# Patient Record
Sex: Female | Born: 1955 | Race: White | Hispanic: No | Marital: Married | Smoking: Former smoker
Health system: Southern US, Community
[De-identification: ages and names within clinical notes are randomized; demographics above are authoritative.]

## PROBLEM LIST (undated history)

## (undated) DIAGNOSIS — M1990 Unspecified osteoarthritis, unspecified site: Secondary | ICD-10-CM

## (undated) DIAGNOSIS — G8929 Other chronic pain: Secondary | ICD-10-CM

## (undated) DIAGNOSIS — M810 Age-related osteoporosis without current pathological fracture: Secondary | ICD-10-CM

## (undated) DIAGNOSIS — E785 Hyperlipidemia, unspecified: Secondary | ICD-10-CM

## (undated) HISTORY — PX: TONSILLECTOMY: SUR1361

## (undated) HISTORY — DX: Hyperlipidemia, unspecified: E78.5

## (undated) HISTORY — DX: Other chronic pain: G89.29

## (undated) HISTORY — PX: WISDOM TOOTH EXTRACTION: SHX21

## (undated) HISTORY — DX: Age-related osteoporosis without current pathological fracture: M81.0

## (undated) HISTORY — DX: Unspecified osteoarthritis, unspecified site: M19.90

---

## 2016-10-19 HISTORY — PX: POLYPECTOMY: SHX149

## 2017-04-19 ENCOUNTER — Encounter (INDEPENDENT_AMBULATORY_CARE_PROVIDER_SITE_OTHER): Payer: Self-pay | Admitting: *Deleted

## 2017-06-07 ENCOUNTER — Encounter: Payer: Self-pay | Admitting: Gastroenterology

## 2017-08-04 ENCOUNTER — Encounter: Payer: Self-pay | Admitting: Gastroenterology

## 2017-08-04 ENCOUNTER — Ambulatory Visit (INDEPENDENT_AMBULATORY_CARE_PROVIDER_SITE_OTHER): Payer: BLUE CROSS/BLUE SHIELD | Admitting: Gastroenterology

## 2017-08-04 ENCOUNTER — Encounter (INDEPENDENT_AMBULATORY_CARE_PROVIDER_SITE_OTHER): Payer: Self-pay

## 2017-08-04 VITALS — BP 106/80 | HR 75 | Ht 65.0 in | Wt 110.0 lb

## 2017-08-04 DIAGNOSIS — Z1211 Encounter for screening for malignant neoplasm of colon: Secondary | ICD-10-CM | POA: Diagnosis not present

## 2017-08-04 MED ORDER — SUPREP BOWEL PREP KIT 17.5-3.13-1.6 GM/177ML PO SOLN: ORAL | 0 refills | Status: DC

## 2017-08-04 NOTE — Progress Notes (Signed)
   HPI :  61 y/o female with a history of HLD here for a new patient visit to discuss colon cancer screening, referred by Dr. Jenetta Downer [place]. Her last colonoscopy was in 02/2008 - good prep, no polyps. She denies any bowel habit changes that bother her. She denies any blood in the stools. She has no abdominal pains. No weight loss. Overall feels well. She denies any complaints. No family history of colon cancer. She states she spoke to her insurance company to see when she was next due for her exam, she states she was told to proceed with it at this time.   Colonoscopy 03/05/2008 - good prep, normal exam, no polyps, 10 year follow up recommended   Past Medical History:  Diagnosis Date  . Hyperlipemia   atypical breast tissue per patient - no breast cancer - no records available   Past Surgical History:  Procedure Laterality Date  . TONSILLECTOMY     Family History  Problem Relation Age of Onset  . Breast cancer Mother    Social History  Substance Use Topics  . Smoking status: Never Smoker  . Smokeless tobacco: Not on file  . Alcohol use No   Current Outpatient Prescriptions  Medication Sig Dispense Refill  . diclofenac (VOLTAREN) 50 MG EC tablet Take 50 mg by mouth 2 (two) times daily.    . raloxifene (EVISTA) 60 MG tablet Take 60 mg by mouth daily.     No current facility-administered medications for this visit.    No Known Allergies   Review of Systems: All systems reviewed and negative except where noted in HPI.    No labs available  Physical Exam: BP 106/80   Pulse 75   Ht 5\' 5"  (1.651 m)   Wt 110 lb (49.9 kg)   BMI 18.30 kg/m  Constitutional: Pleasant,well-developed, female in no acute distress. HEENT: Normocephalic and atraumatic. Conjunctivae are normal. No scleral icterus. Neck supple.  Cardiovascular: Normal rate, regular rhythm.  Pulmonary/chest: Effort normal and breath sounds normal. No wheezing, rales or rhonchi. Abdominal: Soft, nondistended,  nontender. There are no masses palpable. No hepatomegaly. Extremities: no edema Lymphadenopathy: No cervical adenopathy noted. Neurological: Alert and oriented to person place and time. Skin: Skin is warm and dry. No rashes noted. Psychiatric: Normal mood and affect. Behavior is normal.   ASSESSMENT AND PLAN: 61 y/o female here to discuss colon cancer screening. Last colonoscopy 02/2008 was normal. We discussed optical colonoscopy versus stool based testing in regards to risks / benefits of each. Following this discussion she wished to proceed with optical colonoscopy. She is not due until 02/2018 for her exam although she states her insurance company told her she can get it at any time between now and then. In this light she preferred to have it done prior to the new year. Will schedule and obtain pre-approval from insurance company. Further recommendations pending the results.   [place] Cellar, MD [facility] Pager [phone]  CC: Laney Pastor Jenetta Downer

## 2017-08-04 NOTE — Patient Instructions (Signed)
You have been scheduled for a colonoscopy. Please follow written instructions given to you at your visit today.  Please pick up your prep supplies at the pharmacy within the next 1-3 days. If you use inhalers (even only as needed), please bring them with you on the day of your procedure. Your physician has requested that you go to www.startemmi.com and enter the access code given to you at your visit today. This web site gives a general overview about your procedure. However, you should still follow specific instructions given to you by our office regarding your preparation for the procedure.  If you are age 43 or older, your body mass index should be between 23-30. Your Body mass index is 18.3 kg/m. If this is out of the aforementioned range listed, please consider follow up with your Primary Care Provider.  If you are age 74 or younger, your body mass index should be between 19-25. Your Body mass index is 18.3 kg/m. If this is out of the aformentioned range listed, please consider follow up with your Primary Care Provider.    Thank you.

## 2017-08-11 ENCOUNTER — Encounter: Payer: Self-pay | Admitting: Gastroenterology

## 2017-08-18 ENCOUNTER — Ambulatory Visit (AMBULATORY_SURGERY_CENTER): Payer: BLUE CROSS/BLUE SHIELD | Admitting: Gastroenterology

## 2017-08-18 ENCOUNTER — Encounter: Payer: Self-pay | Admitting: Gastroenterology

## 2017-08-18 VITALS — BP 110/63 | HR 79 | Temp 97.3°F | Resp 9 | Ht 65.0 in | Wt 110.0 lb

## 2017-08-18 DIAGNOSIS — D126 Benign neoplasm of colon, unspecified: Secondary | ICD-10-CM | POA: Diagnosis not present

## 2017-08-18 DIAGNOSIS — Z1211 Encounter for screening for malignant neoplasm of colon: Secondary | ICD-10-CM

## 2017-08-18 DIAGNOSIS — D120 Benign neoplasm of cecum: Secondary | ICD-10-CM | POA: Diagnosis not present

## 2017-08-18 DIAGNOSIS — Z1212 Encounter for screening for malignant neoplasm of rectum: Secondary | ICD-10-CM

## 2017-08-18 HISTORY — PX: COLONOSCOPY: SHX174

## 2017-08-18 MED ORDER — SODIUM CHLORIDE 0.9 % IV SOLN: 500.0000 mL | INTRAVENOUS | Status: DC

## 2017-08-18 NOTE — Progress Notes (Signed)
Called to room to assist during endoscopic procedure.  Patient ID and intended procedure confirmed with present staff. Received instructions for my participation in the procedure from the performing physician.  

## 2017-08-18 NOTE — Op Note (Signed)
[facility] Patient Name: Alexis Cooper Procedure Date: 08/18/2017 9:11 AM MRN: [id number] Endoscopist: Remo Lipps P. Armbruster MD, MD Age: 62 Referring MD:  Date of Birth: [birth year] Gender: Female Account #: [id number] Procedure:                Colonoscopy Indications:              Screening for colorectal malignant neoplasm, This                            is the patient's first colonoscopy Medicines:                Monitored Anesthesia Care Procedure:                Pre-Anesthesia Assessment:                           - Prior to the procedure, a History and Physical                            was performed, and patient medications and                            allergies were reviewed. The patient's tolerance of                            previous anesthesia was also reviewed. The risks                            and benefits of the procedure and the sedation                            options and risks were discussed with the patient.                            All questions were answered, and informed consent                            was obtained. Prior Anticoagulants: The patient has                            taken no previous anticoagulant or antiplatelet                            agents. ASA Grade Assessment: II - A patient with                            mild systemic disease. After reviewing the risks                            and benefits, the patient was deemed in                            satisfactory condition to undergo the procedure.  After obtaining informed consent, the colonoscope                            was passed under direct vision. Throughout the                            procedure, the patient's blood pressure, pulse, and                            oxygen saturations were monitored continuously. The                            Colonoscope was introduced through the anus and                            advanced to the  the cecum, identified by                            appendiceal orifice and ileocecal valve. The                            colonoscopy was performed without difficulty. The                            patient tolerated the procedure well. The quality                            of the bowel preparation was good. The ileocecal                            valve, appendiceal orifice, and rectum were                            photographed. Scope In: 9:20:47 AM Scope Out: 9:39:44 AM Scope Withdrawal Time: 0 hours 14 minutes 32 seconds  Total Procedure Duration: 0 hours 18 minutes 57 seconds  Findings:                 The perianal and digital rectal examinations were                            normal.                           A 4 to 5 mm polyp was found in the cecum. The polyp                            was flat. The polyp was removed with a cold snare.                            Resection and retrieval were complete.                           A few small-mouthed diverticula were found in the  sigmoid colon.                           Internal hemorrhoids were found during retroflexion.                           The colon was tortuous.                           The exam was otherwise without abnormality. Complications:            No immediate complications. Estimated blood loss:                            Minimal. Estimated Blood Loss:     Estimated blood loss was minimal. Impression:               - One 4 to 5 mm polyp in the cecum, removed with a                            cold snare. Resected and retrieved.                           - Diverticulosis in the sigmoid colon.                           - Internal hemorrhoids.                           - Tortuous colon.                           - The examination was otherwise normal. Recommendation:           - Patient has a contact number available for                            emergencies. The signs and symptoms of  potential                            delayed complications were discussed with the                            patient. Return to normal activities tomorrow.                            Written discharge instructions were provided to the                            patient.                           - Resume previous diet.                           - Continue present medications.                           -  Await pathology results.                           - Repeat colonoscopy is recommended for                            surveillance. The colonoscopy date will be                            determined after pathology results from today's                            exam become available for review. Remo Lipps P. Armbruster MD, MD 08/18/2017 9:45:20 AM This report has been signed electronically.

## 2017-08-18 NOTE — Patient Instructions (Signed)
Handouts given on polyps and diverticulosis  YOU HAD AN ENDOSCOPIC PROCEDURE TODAY: Refer to the procedure report and other information in the discharge instructions given to you for any specific questions about what was found during the examination. If this information does not answer your questions, please call [place] office at [phone] to clarify.   YOU SHOULD EXPECT: Some feelings of bloating in the abdomen. Passage of more gas than usual. Walking can help get rid of the air that was put into your GI tract during the procedure and reduce the bloating. If you had a lower endoscopy (such as a colonoscopy or flexible sigmoidoscopy) you may notice spotting of blood in your stool or on the toilet paper. Some abdominal soreness may be present for a day or two, also.  DIET: Your first meal following the procedure should be a light meal and then it is ok to progress to your normal diet. A half-sandwich or bowl of soup is an example of a good first meal. Heavy or fried foods are harder to digest and may make you feel nauseous or bloated. Drink plenty of fluids but you should avoid alcoholic beverages for 24 hours. If you had a esophageal dilation, please see attached instructions for diet.    ACTIVITY: Your care partner should take you home directly after the procedure. You should plan to take it easy, moving slowly for the rest of the day. You can resume normal activity the day after the procedure however YOU SHOULD NOT DRIVE, use power tools, machinery or perform tasks that involve climbing or major physical exertion for 24 hours (because of the sedation medicines used during the test).   SYMPTOMS TO REPORT IMMEDIATELY: A gastroenterologist can be reached at any hour. Please call [phone]  for any of the following symptoms:  Following lower endoscopy (colonoscopy, flexible sigmoidoscopy) Excessive amounts of blood in the stool  Significant tenderness, worsening of abdominal pains  Swelling of  the abdomen that is new, acute  Fever of 100 or higher    FOLLOW UP:  If any biopsies were taken you will be contacted by phone or by letter within the next 1-3 weeks. Call [phone]  if you have not heard about the biopsies in 3 weeks.  Please also call with any specific questions about appointments or follow up tests.  

## 2017-08-18 NOTE — Progress Notes (Signed)
A and O x3. Report to RN. Tolerated MAC anesthesia well.

## 2017-08-19 ENCOUNTER — Telehealth: Payer: Self-pay

## 2017-08-19 NOTE — Telephone Encounter (Signed)
Called #[phone] and left a messaged we tried to reach pt for a follow up call. maw

## 2017-08-19 NOTE — Telephone Encounter (Signed)
Called #[phone] and left a messaged we tried to reach pt for a follow up call. maw

## 2017-08-23 ENCOUNTER — Encounter: Payer: Self-pay | Admitting: Gastroenterology

## 2020-02-13 ENCOUNTER — Telehealth: Payer: Self-pay | Admitting: Gastroenterology

## 2020-02-13 NOTE — Telephone Encounter (Signed)
Patient has had fever, lower abdominal pain, and cramping.  She will come in and see Dr. Havery Moros tomorrow for eval.  She is feeling some better today, if she is symptom free tomorrow she will cancel.

## 2020-02-13 NOTE — Telephone Encounter (Signed)
Patient called requesting to come in possibly today to see Dr. Havery Moros for what she thinks is Diverticulitis went over schedule with her along with [employer] and she would like to get advise since there is nothing available for today

## 2020-02-14 ENCOUNTER — Encounter: Payer: Self-pay | Admitting: Gastroenterology

## 2020-02-14 ENCOUNTER — Other Ambulatory Visit: Payer: Self-pay

## 2020-02-14 ENCOUNTER — Ambulatory Visit (INDEPENDENT_AMBULATORY_CARE_PROVIDER_SITE_OTHER)
Admission: RE | Admit: 2020-02-14 | Discharge: 2020-02-14 | Disposition: A | Discharge status: Home / Self Care | Payer: BC Managed Care – PPO | Source: Ambulatory Visit | Attending: Gastroenterology | Admitting: Gastroenterology

## 2020-02-14 ENCOUNTER — Other Ambulatory Visit (INDEPENDENT_AMBULATORY_CARE_PROVIDER_SITE_OTHER): Payer: BC Managed Care – PPO

## 2020-02-14 ENCOUNTER — Other Ambulatory Visit: Payer: Self-pay | Admitting: Gastroenterology

## 2020-02-14 ENCOUNTER — Ambulatory Visit: Payer: BC Managed Care – PPO | Admitting: Gastroenterology

## 2020-02-14 VITALS — BP 100/60 | HR 80 | Temp 98.7°F | Ht 65.0 in | Wt 109.0 lb

## 2020-02-14 DIAGNOSIS — R1030 Lower abdominal pain, unspecified: Secondary | ICD-10-CM | POA: Diagnosis not present

## 2020-02-14 DIAGNOSIS — Z8719 Personal history of other diseases of the digestive system: Secondary | ICD-10-CM

## 2020-02-14 DIAGNOSIS — R509 Fever, unspecified: Secondary | ICD-10-CM

## 2020-02-14 DIAGNOSIS — K5732 Diverticulitis of large intestine without perforation or abscess without bleeding: Secondary | ICD-10-CM

## 2020-02-14 LAB — COMPREHENSIVE METABOLIC PANEL
ALT: 12 U/L (ref 0–35)
AST: 17 U/L (ref 0–37)
Albumin: 4.6 g/dL (ref 3.5–5.2)
Alkaline Phosphatase: 54 U/L (ref 39–117)
BUN: 18 mg/dL (ref 6–23)
CO2: 29 mEq/L (ref 19–32)
Calcium: 10 mg/dL (ref 8.4–10.5)
Chloride: 101 mEq/L (ref 96–112)
Creatinine, Ser: 0.73 mg/dL (ref 0.40–1.20)
GFR: 80.7 mL/min (ref >60.00)
Glucose, Bld: 100 mg/dL — ABNORMAL HIGH (ref 70–99)
Potassium: 3.8 mEq/L (ref 3.5–5.1)
Sodium: 138 mEq/L (ref 135–145)
Total Bilirubin: 0.5 mg/dL (ref 0.2–1.2)
Total Protein: 7.5 g/dL (ref 6.0–8.3)

## 2020-02-14 LAB — CBC WITH DIFFERENTIAL/PLATELET
Basophils Absolute: 0 10*3/uL (ref 0.0–0.1)
Basophils Relative: 0.3 % (ref 0.0–3.0)
Eosinophils Absolute: 0 10*3/uL (ref 0.0–0.7)
Eosinophils Relative: 0.4 % (ref 0.0–5.0)
HCT: 38.9 % (ref 36.0–46.0)
Hemoglobin: 13.1 g/dL (ref 12.0–15.0)
Lymphocytes Relative: 28.3 % (ref 12.0–46.0)
Lymphs Abs: 1.7 10*3/uL (ref 0.7–4.0)
MCHC: 33.7 g/dL (ref 30.0–36.0)
MCV: 86.9 fl (ref 78.0–100.0)
Monocytes Absolute: 0.5 10*3/uL (ref 0.1–1.0)
Monocytes Relative: 7.5 % (ref 3.0–12.0)
Neutro Abs: 3.9 10*3/uL (ref 1.4–7.7)
Neutrophils Relative %: 63.5 % (ref 43.0–77.0)
Platelets: 214 10*3/uL (ref 150.0–400.0)
RBC: 4.48 Mil/uL (ref 3.87–5.11)
RDW: 12.9 % (ref 11.5–15.5)
WBC: 6.1 10*3/uL (ref 4.0–10.5)

## 2020-02-14 MED ORDER — IOHEXOL 300 MG/ML  SOLN
100.0000 mL | Freq: Once | INTRAMUSCULAR | Status: AC | PRN
  Administered 2020-02-14: 100 mL via INTRAVENOUS

## 2020-02-14 MED ORDER — CIPROFLOXACIN HCL 500 MG PO TABS: 500.0000 mg | ORAL_TABLET | Freq: Two times a day (BID) | ORAL | 0 refills | Status: DC

## 2020-02-14 MED ORDER — METRONIDAZOLE 500 MG PO TABS: 500.0000 mg | ORAL_TABLET | Freq: Three times a day (TID) | ORAL | 0 refills | Status: DC

## 2020-02-14 NOTE — Progress Notes (Signed)
HPI :  64 y/o female known to me from prior colonoscopy in 07/2017, here for an acute visit for abdominal pain. Patient states she was visiting her parents this past week and ate a lot of nuts. Sunday AM she felt a bit "off" and woke up that night after she fell asleep with severe lower abdominal pain, rated 7-8/10. It would come in waves intermittently, not constant. Monday AM she had continued pain but went to work. Around 430 PM she went home and had a low grade temp to 100.4, and then repeated temp at 8 PM and noted to be 101.8.  She continued to have severe lower diffuse abdominal pain during that time.  She got up yesterday morning and states she felt a bit better, fever seems to have broken and not recurred.  Due to her fever she had a COVID-19 test that was negative.  She states that it's worst the pain was 7-8 out of 10.  It would come in waves but more bothersome to her when she is moving such as walking.  She denies any nausea vomiting.  She has not been wanting to eat and has not been eating much.  She is tolerating liquids.  She states she has some mild constipation at baseline but no blood in her stools or problems.  She states the pain has reduced over the past 24 to 48 hours, currently rated at 3 out of 10 but she has ongoing tenderness in her lower abdomen and remains uncomfortable.  She has never had anything like this before.  She had a colonoscopy with me in 2018 which showed some diverticulosis in the left colon and a small sessile serrated polyp that was removed.  Colonoscopy 08/18/2017 - The perianal and digital rectal examinations were normal. - A 4 to 5 mm polyp was found in the cecum. The polyp was flat. The polyp was removed with a cold snare. Resection and retrieval were complete. - A few small-mouthed diverticula were found in the sigmoid colon. - Internal hemorrhoids were found during retroflexion. - The colon was tortuous. - The exam was otherwise without  abnormality.  Path c/w sessile serrated polyp, repeat exam in 5 years    Past Medical History:  Diagnosis Date  . Chronic neck pain   . Hyperlipemia      Past Surgical History:  Procedure Laterality Date  . COLONOSCOPY    . TONSILLECTOMY     Family History  Problem Relation Age of Onset  . Breast cancer Mother   . Colon cancer Neg Hx   . Colon polyps Neg Hx   . Esophageal cancer Neg Hx   . Rectal cancer Neg Hx   . Stomach cancer Neg Hx    Social History   Tobacco Use  . Smoking status: Never Smoker  . Smokeless tobacco: Never Used  Substance Use Topics  . Alcohol use: Yes    Comment: rarely  . Drug use: No   Current Outpatient Medications  Medication Sig Dispense Refill  . diclofenac (VOLTAREN) 50 MG EC tablet Take 50 mg by mouth 2 (two) times daily. Usually only takes once daily    . raloxifene (EVISTA) 60 MG tablet Take 60 mg by mouth daily.    . rosuvastatin (CRESTOR) 10 MG tablet Take 10 mg by mouth daily.     No current facility-administered medications for this visit.   No Known Allergies   Review of Systems: All systems reviewed and negative except where noted in  HPI.    No results found.  Physical Exam: BP 100/60   Pulse 80   Temp 98.7 F (37.1 C)   Ht _0  (1.651 m)   Wt 109 lb (49.4 kg)   SpO2 98%   BMI 18.14 kg/m   Constitutional: Pleasant,well-developed, female in no acute distress. HEENT: Normocephalic and atraumatic. Conjunctivae are normal. No scleral icterus. Neck supple.  Cardiovascular: Normal rate, regular rhythm.  Pulmonary/chest: Effort normal and breath sounds normal. No wheezing, rales or rhonchi. Abdominal: Soft, nondistended, tenderness diffusely in the lower abdomen, no peritoneal signs.  Palpable abdominal aorta.   Extremities: no edema Lymphadenopathy: No cervical adenopathy noted. Neurological: Alert and oriented to person place and time. Skin: Skin is warm and dry. No rashes noted. Psychiatric: Normal mood and  affect. Behavior is normal.   ASSESSMENT AND PLAN: 64 year old female here for an acute visit regarding the following:  Abdominal pain / fever - as outlined above, acute onset of abdominal pain over the weekend and Monday associated with fever.  Over the past 2 days the fever appears to have resolved, and pain improving, but not resolved and she remains quite tender on exam today.  I discussed differential diagnosis with her.  I am most concerned for diverticulitis based on this presentation.  She has never had diverticulitis before.  Recommend CBC and c-Met now, and we were able to coordinate CT scan abdomen pelvis with contrast this afternoon to further evaluate, assess for diverticulitis or other pathology to cause this.  I will contact her when I have the results of her labs and scan back, she may warrant antibiotics pending results, that being said she does appear improved and her vitals are stable and reassuring in the office this morning.  Recommend she stay on liquid diet for now until we have results of her scan and labs back. Best contact phone is [phone], we will contact her with results of scan once this is done.   She agreed with the plan, all questions answered.  [place] Cellar, MD [facility]

## 2020-02-14 NOTE — Patient Instructions (Addendum)
If you are age 62 or older, your body mass index should be between 23-30. Your Body mass index is 18.14 kg/m. If this is out of the aforementioned range listed, please consider follow up with your Primary Care Provider.  If you are age 38 or younger, your body mass index should be between 19-25. Your Body mass index is 18.14 kg/m. If this is out of the aformentioned range listed, please consider follow up with your Primary Care Provider.   ____________________________________________________________________  Alexis Cooper have been scheduled for a CT scan of the abdomen and pelvis at [facility] ([address]---this is in the same building as [employer]).   You are scheduled today, 02-14-20 at 3:30pm. You should arrive 15 minutes prior to your appointment time for registration. Please follow the written instructions below on the day of your exam:  WARNING: IF YOU ARE ALLERGIC TO IODINE/X-RAY DYE, PLEASE NOTIFY RADIOLOGY IMMEDIATELY AT [id number]! YOU WILL BE GIVEN A 13 HOUR PREMEDICATION PREP.  1) Do not eat anything after 11:30am (4 hours prior to your test) 2) You have been given 2 bottles of oral contrast to drink. The solution may taste better if refrigerated, but do NOT add ice or any other liquid to this solution. Shake well before drinking.    Drink 1 bottle of contrast @ 1:30pm (2 hours prior to your exam)  Drink 1 bottle of contrast @ 2:30pm (1 hour prior to your exam)  You may take any medications as prescribed with a small amount of water, if necessary. If you take any of the following medications: METFORMIN, GLUCOPHAGE, GLUCOVANCE, AVANDAMET, RIOMET, FORTAMET, [place] MET, JANUMET, GLUMETZA or METAGLIP, you MAY be asked to HOLD this medication 48 hours AFTER the exam.  The purpose of you drinking the oral contrast is to aid in the visualization of your intestinal tract. The contrast solution may cause some diarrhea. Depending on your individual set of symptoms,  you may also receive an intravenous injection of x-ray contrast/dye. Plan on being at [facility] for 30 minutes or longer, depending on the type of exam you are having performed.  This test typically takes 30-45 minutes to complete.  If you have any questions regarding your exam or if you need to reschedule, you may call the CT department at [phone] between the hours of 8:00 am and 5:00 pm, Monday-Friday.  ______________________________________________________________________   Please go to the lab in the basement of our building to have lab work done as you leave today. Hit "B" for basement when you get on the elevator.  When the doors open the lab is on your left.  We will call you with the results. Thank you.   Due to recent changes in healthcare laws, you may see the results of your imaging and laboratory studies on MyChart before your provider has had a chance to review them.  We understand that in some cases there may be results that are confusing or concerning to you. Not all laboratory results come back in the same time frame and the provider may be waiting for multiple results in order to interpret others.  Please give [place] 48 hours in order for your provider to thoroughly review all the results before contacting the office for clarification of your results.     Thank you for entrusting me with your care and for choosing [facility], Dr. Burket Cellar

## 2020-04-08 ENCOUNTER — Ambulatory Visit (AMBULATORY_SURGERY_CENTER): Payer: Self-pay

## 2020-04-08 ENCOUNTER — Other Ambulatory Visit: Payer: Self-pay

## 2020-04-08 VITALS — Ht 65.0 in | Wt 117.0 lb

## 2020-04-08 DIAGNOSIS — K5732 Diverticulitis of large intestine without perforation or abscess without bleeding: Secondary | ICD-10-CM

## 2020-04-08 DIAGNOSIS — Z8719 Personal history of other diseases of the digestive system: Secondary | ICD-10-CM

## 2020-04-08 DIAGNOSIS — R1030 Lower abdominal pain, unspecified: Secondary | ICD-10-CM

## 2020-04-08 NOTE — Progress Notes (Signed)
No egg or soy allergy known to patient  No issues with past sedation with any surgeries  or procedures, no intubation problems  No diet pills per patient No home 02 use per patient  No blood thinners per patient  Pt denies issues with constipation  No A fib or A flutter  EMMI video sent to pt's e mail    Pt has been vaccinated for covid  Due to the COVID-19 pandemic we are asking patients to follow these guidelines. Please only bring one care partner. Please be aware that your care partner may wait in the car in the parking lot or if they feel like they will be too hot to wait in the car, they may wait in the lobby on the 4th floor. All care partners are required to wear a mask the entire time (we do not have any that we can provide them), they need to practice social distancing, and we will do a Covid check for all patient's and care partners when you arrive. Also we will check their temperature and your temperature. If the care partner waits in their car they need to stay in the parking lot the entire time and we will call them on their cell phone when the patient is ready for discharge so they can bring the car to the front of the building. Also all patient's will need to wear a mask into building.

## 2020-04-09 ENCOUNTER — Encounter: Payer: Self-pay | Admitting: Gastroenterology

## 2020-04-17 ENCOUNTER — Encounter: Payer: Self-pay | Admitting: Gastroenterology

## 2020-04-17 ENCOUNTER — Ambulatory Visit (AMBULATORY_SURGERY_CENTER): Payer: BC Managed Care – PPO | Admitting: Gastroenterology

## 2020-04-17 ENCOUNTER — Other Ambulatory Visit: Payer: Self-pay

## 2020-04-17 VITALS — BP 112/67 | HR 65 | Temp 97.8°F | Resp 15 | Ht 65.0 in | Wt 117.0 lb

## 2020-04-17 DIAGNOSIS — K5792 Diverticulitis of intestine, part unspecified, without perforation or abscess without bleeding: Secondary | ICD-10-CM | POA: Diagnosis not present

## 2020-04-17 DIAGNOSIS — R1030 Lower abdominal pain, unspecified: Secondary | ICD-10-CM

## 2020-04-17 MED ORDER — SODIUM CHLORIDE 0.9 % IV SOLN: 500.0000 mL | Freq: Once | INTRAVENOUS | Status: DC

## 2020-04-17 NOTE — Op Note (Signed)
[place] Patient Name: Alexis Cooper Procedure Date: 04/17/2020 7:56 AM MRN: [id number] Endoscopist: Remo Lipps P. Havery Moros , MD Age: 64 Referring MD:  Date of Birth: 05/23/1956 Gender: Female Account #: [id number] Procedure:                Flexible Sigmoidoscopy Indications:              Follow-up of diverticulitis noted on CT scan april                            2021, first time episode Medicines:                Monitored Anesthesia Care Procedure:                Pre-Anesthesia Assessment:                           - Prior to the procedure, a History and Physical                            was performed, and patient medications and                            allergies were reviewed. The patient's tolerance of                            previous anesthesia was also reviewed. The risks                            and benefits of the procedure and the sedation                            options and risks were discussed with the patient.                            All questions were answered, and informed consent                            was obtained. Prior Anticoagulants: The patient has                            taken no previous anticoagulant or antiplatelet                            agents. ASA Grade Assessment: II - A patient with                            mild systemic disease. After reviewing the risks                            and benefits, the patient was deemed in                            satisfactory condition to undergo the procedure.  After obtaining informed consent, the scope was                            passed under direct vision. The Colonoscope was                            introduced through the anus and advanced to the the                            splenic flexure. The flexible sigmoidoscopy was                            accomplished without difficulty. The patient                            tolerated the procedure  well. Scope In: 8:04:15 AM Scope Out: 8:17:13 AM Total Procedure Duration: 0 hours 12 minutes 58 seconds  Findings:                 The perianal and digital rectal examinations were                            normal.                           Multiple small-mouthed diverticula were found in                            the distal to mid sigmoid colon.                           The sigmoid colon was angulated / tortous. Some                            residual stool was noted in the left colon, several                            minutes spent lavaging to clear it. The exam was                            otherwise without abnormality. Complications:            No immediate complications. Estimated blood loss:                            None. Estimated Blood Loss:     Estimated blood loss: none. Impression:               - Diverticulosis in the sigmoid colon which                            correlates to CT diagnosis of diverticulitis                           - Angulated / tortous sigmoid colon.                           -  The examination was otherwise normal. Recommendation:           - Discharge patient to home.                           - Resume previous diet.                           - Continue present medications.                           - Call if any recurrence of diverticulitis symptoms                            moving forward [place]. Havery Moros, MD 04/17/2020 8:22:49 AM This report has been signed electronically.

## 2020-04-17 NOTE — Patient Instructions (Signed)
Please read handouts provided. Continue present medications.   YOU HAD AN ENDOSCOPIC PROCEDURE TODAY AT THE [facility]:   Refer to the procedure report that was given to you for any specific questions about what was found during the examination.  If the procedure report does not answer your questions, please call your gastroenterologist to clarify.  If you requested that your care partner not be given the details of your procedure findings, then the procedure report has been included in a sealed envelope for you to review at your convenience later.  YOU SHOULD EXPECT: Some feelings of bloating in the abdomen. Passage of more gas than usual.  Walking can help get rid of the air that was put into your GI tract during the procedure and reduce the bloating. If you had a lower endoscopy (such as a colonoscopy or flexible sigmoidoscopy) you may notice spotting of blood in your stool or on the toilet paper. If you underwent a bowel prep for your procedure, you may not have a normal bowel movement for a few days.  Please Note:  You might notice some irritation and congestion in your nose or some drainage.  This is from the oxygen used during your procedure.  There is no need for concern and it should clear up in a day or so.  SYMPTOMS TO REPORT IMMEDIATELY:  Following lower endoscopy (colonoscopy or flexible sigmoidoscopy):  Excessive amounts of blood in the stool  Significant tenderness or worsening of abdominal pains  Swelling of the abdomen that is new, acute  Fever of 100F or higher   For urgent or emergent issues, a gastroenterologist can be reached at any hour by calling [phone]. Do not use MyChart messaging for urgent concerns.    DIET:  We do recommend a small meal at first, but then you may proceed to your regular diet.  Drink plenty of fluids but you should avoid alcoholic beverages for 24 hours.  ACTIVITY:  You should plan to take it easy for the rest of today and  you should NOT DRIVE or use heavy machinery until tomorrow (because of the sedation medicines used during the test).    FOLLOW UP: Our staff will call the number listed on your records 48-72 hours following your procedure to check on you and address any questions or concerns that you may have regarding the information given to you following your procedure. If we do not reach you, we will leave a message.  We will attempt to reach you two times.  During this call, we will ask if you have developed any symptoms of COVID 19. If you develop any symptoms (ie: fever, flu-like symptoms, shortness of breath, cough etc.) before then, please call [phone].  If you test positive for Covid 19 in the 2 weeks post procedure, please call and report this information to us.    If any biopsies were taken you will be contacted by phone or by letter within the next 1-3 weeks.  Please call us at [phone] if you have not heard about the biopsies in 3 weeks.    SIGNATURES/CONFIDENTIALITY: You and/or your care partner have signed paperwork which will be entered into your electronic medical record.  These signatures attest to the fact that that the information above on your After Visit Summary has been reviewed and is understood.  Full responsibility of the confidentiality of this discharge information lies with you and/or your care-partner.  

## 2020-04-17 NOTE — Progress Notes (Signed)
Pt's states no medical or surgical changes since previsit or office visit.   V/s-cw  Check-in-jb 

## 2020-04-17 NOTE — Progress Notes (Signed)
A and O x3. Report to RN. Tolerated MAC anesthesia well.

## 2020-04-19 ENCOUNTER — Telehealth: Payer: Self-pay

## 2020-04-19 NOTE — Telephone Encounter (Signed)
  Follow up Call-  Call back number 04/17/2020 08/18/2017  Post procedure Call Back phone  # [phone]  Permission to leave phone message Yes Yes  Some recent data might be hidden     Patient questions:  Do you have a fever, pain , or abdominal swelling? No. Pain Score  0 *  Have you tolerated food without any problems? Yes.    Have you been able to return to your normal activities? Yes.    Do you have any questions about your discharge instructions: Diet   No. Medications  No. Follow up visit  No.  Do you have questions or concerns about your Care? No.  Actions: * If pain score is 4 or above: No action needed, pain <4.  1. Have you developed a fever since your procedure? no  2.   Have you had an respiratory symptoms (SOB or cough) since your procedure? no  3.   Have you tested positive for COVID 19 since your procedure no  4.   Have you had any family members/close contacts diagnosed with the COVID 19 since your procedure?  no   If yes to any of these questions please route to Joylene John, RN and Erenest Rasher, RN

## 2020-04-19 NOTE — Telephone Encounter (Signed)
LVM

## 2020-06-27 ENCOUNTER — Other Ambulatory Visit: Payer: Self-pay | Admitting: Gastroenterology

## 2020-06-27 ENCOUNTER — Telehealth: Payer: Self-pay | Admitting: Gastroenterology

## 2020-06-27 DIAGNOSIS — K5732 Diverticulitis of large intestine without perforation or abscess without bleeding: Secondary | ICD-10-CM

## 2020-06-27 MED ORDER — METRONIDAZOLE 500 MG PO TABS: 500.0000 mg | ORAL_TABLET | Freq: Three times a day (TID) | ORAL | 0 refills | Status: AC

## 2020-06-27 MED ORDER — CIPROFLOXACIN HCL 500 MG PO TABS: 500.0000 mg | ORAL_TABLET | Freq: Two times a day (BID) | ORAL | 0 refills | Status: AC

## 2020-06-27 NOTE — Telephone Encounter (Signed)
Patient called having another episode of Diverticulitis

## 2020-06-27 NOTE — Telephone Encounter (Signed)
Spoke with patient,hx of diverticulitis, had a flare around the end of April, she states that she woke up in the middle of the night with severe stomach cramps, a lot of diarrhea last night and today, chills, fever of 100/100.8, pt states that she went to work but just didn't feel right and went home around 11 am and tried to rest. She states that her gum was inflamed just completed Amoxicillin, was on this for 7 days, was supposed to have root canal tomorrow and had to cancel. Please advise, thank you

## 2020-06-27 NOTE — Telephone Encounter (Signed)
I called the patient.  She has developed some abdominal pain last night as well as a fever, reminds her of her prior diverticulitis symptoms but perhaps not as bad.  She did have a loose stool this morning as well.  Interestingly finished a course of amoxicillin in preparation for root canal.  Discussed differential diagnosis with her.  Her symptoms sound most consistent with diverticulitis in regards to her prior symptoms when she had this previously.  C. difficile also on the differential but does not sound like the diarrhea is significant.  Discussed options, given her low-grade temp and pain we will empirically treat her for diverticulitis with Flagyl 500 mg 3 times daily and Cipro 500 twice daily for 1 week as we are heading into the weekend.  If she has worsening despite this regimen or has worsening diarrhea, I asked her to call me if she is not getting better.  She agreed with the plan, all questions answered.

## 2020-08-19 ENCOUNTER — Telehealth: Payer: Self-pay

## 2020-08-19 ENCOUNTER — Other Ambulatory Visit: Payer: Self-pay

## 2020-08-19 DIAGNOSIS — N289 Disorder of kidney and ureter, unspecified: Secondary | ICD-10-CM

## 2020-08-19 NOTE — Telephone Encounter (Signed)
-----   Message from Yetta Flock, MD sent at 08/19/2020 11:23 AM EDT ----- Thanks, she need a regular MRI abdomen with contrast for follow up of renal lesion. Thanks for helping and the reminder.   Dr. Loni Muse ----- Message ----- From: Yevette Edwards, RN Sent: 08/19/2020   8:23 AM EDT To: Yetta Flock, MD  Patient is due for an MRI per 02/15/20 CT scan results. Would you like MRI Abdomen/Pelvis with contrast? Please advise, thank you. ----- Message ----- From: Marlon Pel, RN Sent: 08/16/2020   3:57 PM EDT To: Yevette Edwards, RN  armbruster ----- Message ----- From: Marlon Pel, RN Sent: 08/16/2020 To: Marlon Pel, RN  Needs MRI - see results 02/15/20 CT

## 2020-08-19 NOTE — Telephone Encounter (Signed)
Spoke with patient, she states that she has had an MRI before and does not have any claustrophobia. Pt has been scheduled for an MRI Abdomen at [facility] on 09/10/20 at 9 AM, patient is to arrive at 8:30 AM. Patient is aware of appt information, will also send to My Chart. Patient had no concerns at the end of the call.

## 2020-09-09 ENCOUNTER — Telehealth: Payer: Self-pay | Admitting: Gastroenterology

## 2020-09-09 NOTE — Telephone Encounter (Signed)
Noted, thank you

## 2020-09-09 NOTE — Telephone Encounter (Signed)
Pt would like for [place] to call [employer] at [phone] to answer questions they have about an MRI pre certification. Pt received a call saying tomorrow's MRI was Cxd and they rebooked her for 11-30 bc they needed more time to get it approved. Note: Pt had a small lesion on her right kidney on 02-14-20 CT abd pelvis,Recommend MRI to follow-up this in 6 to 12 months.   Called [employer] and spoke to [place].  Sending her CT from 02-14-20 showing remarks regarding renal lesion. May need peer to peer.  They will let us know within 48 hours

## 2020-09-10 ENCOUNTER — Ambulatory Visit (HOSPITAL_COMMUNITY): Payer: BC Managed Care – PPO

## 2020-09-11 NOTE — Telephone Encounter (Signed)
Jan, just an [place]. Thanks

## 2020-09-11 NOTE — Telephone Encounter (Signed)
Okay thank you. This MRI was recommended by radiology to follow up CT scan abnormality. Agree she should call her insurance to sort it out. Thanks

## 2020-09-11 NOTE — Telephone Encounter (Signed)
Spoke with patient, she states that her MRI was cancelled again, radiology called her and stated that they received notification from her insurance that it has been denied. Patient states that she is going to reach out to her insurance company to see if she can find out any more information. Advised that I would send this to Dr. Havery Moros for further recommendations, patient is aware that the office closes at 2 pm today and will re-open on Monday at 8 AM. Apologized to the patient for the inconvenience of being cancelled and rescheduled twice. Please advise, thank you

## 2020-09-17 ENCOUNTER — Ambulatory Visit (HOSPITAL_COMMUNITY): Payer: BC Managed Care – PPO

## 2020-09-17 ENCOUNTER — Encounter (HOSPITAL_COMMUNITY): Payer: Self-pay

## 2020-09-18 ENCOUNTER — Ambulatory Visit: Payer: BC Managed Care – PPO | Admitting: Podiatry

## 2020-09-18 ENCOUNTER — Other Ambulatory Visit: Payer: Self-pay

## 2020-09-18 DIAGNOSIS — L603 Nail dystrophy: Secondary | ICD-10-CM | POA: Diagnosis not present

## 2020-09-19 ENCOUNTER — Encounter: Payer: Self-pay | Admitting: Podiatry

## 2020-09-19 NOTE — Telephone Encounter (Signed)
As discussed, patient will need to reach out to insurance company in regards to coverage. All supporting documentation has been sent and appropriate diagnoses has been used (Renal lesion), the previous CT scan has been faxed with the radiologist recommendation for MRI in 6-12 months. Thanks  Will forward to Dr. Havery Moros

## 2020-09-19 NOTE — Telephone Encounter (Signed)
Spoke w/[employer] again.  They are stating it is not being covered due to the DX code being used.  (disorder of kidney, not specified) Pt has called and upset this is not being covered.  Please advise?

## 2020-09-19 NOTE — Progress Notes (Signed)
  Subjective:  Patient ID: Alexis Cooper, female    DOB: [birth year],  MRN: [id number]  Chief Complaint  Patient presents with  . Nail Problem    pt states that her toenails are cracking bilaterally and she has sores between her toes     64 y.o. female presents with the above complaint.  Patient presents with complaint of toenails becoming thinner x10.  Patient states that this has been going on for quite some time.  Patient has been putting her nail polish on for many years and after she removed the nail polish she realized that her nails have thinned out very well.  She tends to wear very close toed shoes without much space in the toebox.  She states that she denies any kind of trauma to the area.  She has not tried any treatment options.  She denies any other acute complaints.  She does take vitamin supplements   Review of Systems: Negative except as noted in the HPI. Denies N/V/F/Ch.  Past Medical History:  Diagnosis Date  . Chronic neck pain   . Hyperlipemia     Current Outpatient Medications:  .  diclofenac (CATAFLAM) 50 MG tablet, Take by mouth., Disp: , Rfl:  .  diclofenac (VOLTAREN) 50 MG EC tablet, Take 50 mg by mouth 2 (two) times daily. Usually only takes once daily, Disp: , Rfl:  .  raloxifene (EVISTA) 60 MG tablet, Take 60 mg by mouth daily., Disp: , Rfl:  .  rosuvastatin (CRESTOR) 10 MG tablet, Take 10 mg by mouth daily., Disp: , Rfl:   Social History   Tobacco Use  Smoking Status Former Smoker  . Years: 3.00  . Types: Cigarettes  . Quit date: 56  . Years since quitting: 36.9  Smokeless Tobacco Never Used    No Known Allergies Objective:  There were no vitals filed for this visit. There is no height or weight on file to calculate BMI. Constitutional Well developed. Well nourished.  Vascular Dorsalis pedis pulses palpable bilaterally. Posterior tibial pulses palpable bilaterally. Capillary refill normal to all digits.  No cyanosis or clubbing noted. Pedal  hair growth normal.  Neurologic Normal speech. Oriented to person, place, and time. Epicritic sensation to light touch grossly present bilaterally.  Dermatologic  thinning of the nails noted to toenails x10.  No discoloration noted.  No dystrophy noted.  Small ridging noted consistent with microtrauma.  No other nail abnormalities noted.  Nail appears well attached to the underlying nail bed  Orthopedic: Normal joint ROM without pain or crepitus bilaterally. No visible deformities. No bony tenderness.   Radiographs: None Assessment:   1. Onychoschizia    Plan:  Patient was evaluated and treated and all questions answered.  Onychoschizia x10 -Explained patient the etiology of onychoschizia various treatment options were discussed.  I discussed with the patient the importance of vitamin supplements to help with the thinning portion of the nail.  I also discussed with the patient that there could be a component of microtrauma from the shoes that could also be causing some bridging across the nail as well.  I discussed shoe gear modification with the patient in extensive detail as well.  Patient states understanding.  No follow-ups on file.

## 2020-09-19 NOTE — Telephone Encounter (Signed)
Pt needs help with auth from [employer], [place] is saying that they need more info

## 2020-09-20 ENCOUNTER — Other Ambulatory Visit: Payer: Self-pay | Admitting: Surgery

## 2020-09-20 NOTE — Telephone Encounter (Signed)
Okay thanks [employer]. Yes she needs to speak with her insurance company directly about this. Not sure what else we can do at this point, they have all the documentation they need, I'm not sure why this is not being covered

## 2020-10-01 ENCOUNTER — Telehealth: Payer: Self-pay | Admitting: Gastroenterology

## 2020-10-01 ENCOUNTER — Other Ambulatory Visit: Payer: Self-pay

## 2020-10-01 DIAGNOSIS — N289 Disorder of kidney and ureter, unspecified: Secondary | ICD-10-CM

## 2020-10-01 NOTE — Telephone Encounter (Signed)
Okay. Would order R renal ultrasound for renal cyst noted on prior CT scan. Thanks

## 2020-10-01 NOTE — Telephone Encounter (Signed)
Patient has been scheduled for Renal US at [facility] on Wednesday, 10/09/20 at 4 PM, arrival time 3:45 PM, patient has to have a full bladder.   Lm on vm for patient to return call, advised that I will be sending her a My Chart message as well with the information that I am calling in regards to.

## 2020-10-01 NOTE — Telephone Encounter (Signed)
Dr. Havery Moros,   Please see below.

## 2020-10-01 NOTE — Telephone Encounter (Signed)
Spoke with patient, she is aware of appointment information. Advised her to give [place] a call if she has any other questions or concerns. Patient verbalized understanding of all information.

## 2020-10-09 ENCOUNTER — Ambulatory Visit (HOSPITAL_COMMUNITY)
Admission: RE | Admit: 2020-10-09 | Discharge: 2020-10-09 | Disposition: A | Discharge status: Home / Self Care | Payer: BC Managed Care – PPO | Source: Ambulatory Visit | Attending: Gastroenterology | Admitting: Gastroenterology

## 2020-10-09 ENCOUNTER — Other Ambulatory Visit: Payer: Self-pay

## 2020-10-09 DIAGNOSIS — N289 Disorder of kidney and ureter, unspecified: Secondary | ICD-10-CM | POA: Insufficient documentation

## 2022-05-25 ENCOUNTER — Encounter: Payer: Self-pay | Admitting: Gastroenterology

## 2022-07-13 ENCOUNTER — Ambulatory Visit (AMBULATORY_SURGERY_CENTER): Payer: Self-pay

## 2022-07-13 VITALS — Ht 65.0 in | Wt 114.0 lb

## 2022-07-13 DIAGNOSIS — Z86010 Personal history of colonic polyps: Secondary | ICD-10-CM

## 2022-07-13 MED ORDER — NA SULFATE-K SULFATE-MG SULF 17.5-3.13-1.6 GM/177ML PO SOLN: 1.0000 | Freq: Once | ORAL | 0 refills | Status: AC

## 2022-07-13 NOTE — Progress Notes (Signed)
No egg or soy allergy known to patient  No issues known to pt with past sedation with any surgeries or procedures Patient denies ever being told they had issues or difficulty with intubation  No FH of Malignant Hyperthermia Pt is not on diet pills Pt is not on home 02  Pt is not on blood thinners  Pt denies issues with constipation;  No A fib or A flutter Have any cardiac testing pending--NO Pt instructed to use Singlecare.com or GoodRx for a price reduction on prep  Insurance verified during PV appt-[employer]

## 2022-08-06 ENCOUNTER — Encounter: Payer: Self-pay | Admitting: Gastroenterology

## 2022-08-14 ENCOUNTER — Ambulatory Visit (AMBULATORY_SURGERY_CENTER): Payer: BC Managed Care – PPO | Admitting: Gastroenterology

## 2022-08-14 ENCOUNTER — Encounter: Payer: Self-pay | Admitting: Gastroenterology

## 2022-08-14 VITALS — BP 132/78 | HR 73 | Temp 97.9°F | Resp 20 | Ht 65.0 in | Wt 114.0 lb

## 2022-08-14 DIAGNOSIS — D125 Benign neoplasm of sigmoid colon: Secondary | ICD-10-CM

## 2022-08-14 DIAGNOSIS — Z86010 Personal history of colonic polyps: Secondary | ICD-10-CM | POA: Diagnosis not present

## 2022-08-14 DIAGNOSIS — Z09 Encounter for follow-up examination after completed treatment for conditions other than malignant neoplasm: Secondary | ICD-10-CM | POA: Diagnosis present

## 2022-08-14 MED ORDER — SODIUM CHLORIDE 0.9 % IV SOLN: 500.0000 mL | Freq: Once | INTRAVENOUS | Status: DC

## 2022-08-14 NOTE — Progress Notes (Signed)
Pt's states no medical or surgical changes since previsit or office visit.   Peeling to lips- peeling from cream received from dermatologist- hasn't used in 10 days

## 2022-08-14 NOTE — Progress Notes (Signed)
Sedate, gd SR, tolerated procedure well, VSS, report to RN 

## 2022-08-14 NOTE — Op Note (Signed)
[place] Patient Name: Alexis Cooper Procedure Date: 08/14/2022 7:38 AM MRN: [id number] Endoscopist: Remo Lipps P. Havery Moros , MD, [id number] Age: 66 Referring MD:  Date of Birth: 12-Apr-1956 Gender: Female Account #: [id number] Procedure:                Colonoscopy Indications:              High risk colon cancer surveillance: Personal                            history of colonic polyps - sessile serrated polyp                            removed 07/2017 Medicines:                Monitored Anesthesia Care Procedure:                Pre-Anesthesia Assessment:                           - Prior to the procedure, a History and Physical                            was performed, and patient medications and                            allergies were reviewed. The patient's tolerance of                            previous anesthesia was also reviewed. The risks                            and benefits of the procedure and the sedation                            options and risks were discussed with the patient.                            All questions were answered, and informed consent                            was obtained. Prior Anticoagulants: The patient has                            taken no anticoagulant or antiplatelet agents. ASA                            Grade Assessment: II - A patient with mild systemic                            disease. After reviewing the risks and benefits,                            the patient was deemed in satisfactory condition to  undergo the procedure.                           After obtaining informed consent, the colonoscope                            was passed under direct vision. Throughout the                            procedure, the patient's blood pressure, pulse, and                            oxygen saturations were monitored continuously. The                            Olympus PCF-H190DL ([id number])  Colonoscope was                            introduced through the anus and advanced to the the                            cecum, identified by appendiceal orifice and                            ileocecal valve. The colonoscopy was performed                            without difficulty. The patient tolerated the                            procedure well. The quality of the bowel                            preparation was good. The ileocecal valve,                            appendiceal orifice, and rectum were photographed. Scope In: 8:04:18 AM Scope Out: 8:24:26 AM Scope Withdrawal Time: 0 hours 14 minutes 36 seconds  Total Procedure Duration: 0 hours 20 minutes 8 seconds  Findings:                 The perianal and digital rectal examinations were                            normal.                           The colon was tortuous - pediatric colonoscope used                            for this exam.                           A 4 mm polyp was found in the sigmoid colon. The  polyp was sessile. The polyp was removed with a                            cold snare. Resection and retrieval were complete.                           Multiple small-mouthed diverticula were found in                            the sigmoid colon.                           The exam was otherwise without abnormality. Complications:            No immediate complications. Estimated blood loss:                            Minimal. Estimated Blood Loss:     Estimated blood loss was minimal. Impression:               - Tortuous colon.                           - One 4 mm polyp in the sigmoid colon, removed with                            a cold snare. Resected and retrieved.                           - Diverticulosis in the sigmoid colon.                           - The examination was otherwise normal. Recommendation:           - Patient has a contact number available for                             emergencies. The signs and symptoms of potential                            delayed complications were discussed with the                            patient. Return to normal activities tomorrow.                            Written discharge instructions were provided to the                            patient.                           - Resume previous diet.                           - Continue present medications.                           -  Await pathology results. Remo Lipps P. Havery Moros, MD 08/14/2022 8:29:29 AM This report has been signed electronically.

## 2022-08-14 NOTE — Progress Notes (Signed)
Called to room to assist during endoscopic procedure.  Patient ID and intended procedure confirmed with present staff. Received instructions for my participation in the procedure from the performing physician.  

## 2022-08-14 NOTE — Progress Notes (Signed)
[facility] History and Physical   Primary Care Physician:  Yvone Neu, MD   Reason for Procedure:   History of colon polyps  Plan:    colonoscopy     HPI: Alexis Cooper is a 66 y.o. female  here for colonoscopy surveillance - history of SSP removed 07/2017. Has a history of diverticulitis.  Patient denies any bowel symptoms at this time. No family history of colon cancer known. Otherwise feels well without any cardiopulmonary symptoms.   I have discussed risks / benefits of anesthesia and endoscopic procedure with Alexis Cooper and they wish to proceed with the exams as outlined today.    Past Medical History:  Diagnosis Date   Chronic neck pain    Hyperlipemia    on meds   Osteoporosis    bone scan confirmation per pt    Past Surgical History:  Procedure Laterality Date   COLONOSCOPY  08/18/2017   SA-MAC-suprep(good)-tics/hems/touruous colon/SSP x 1   POLYPECTOMY  2018   SSP   TONSILLECTOMY     WISDOM TOOTH EXTRACTION      Prior to Admission medications   Medication Sig Start Date End Date Taking? Authorizing Provider  BIOTIN PO Take 1 tablet by mouth daily at 6 (six) AM.   Yes [provider]  Calcium Carb-Cholecalciferol (CALCIUM 1000 + D PO) Take 2 tablets by mouth daily at 6 (six) AM.   Yes [provider]  Cholecalciferol (VITAMIN D3 PO) Take 1 tablet by mouth daily at 6 (six) AM.   Yes [provider]  Coenzyme Q10 (COQ10 PO) Take 1 tablet by mouth daily at 6 (six) AM.   Yes [provider]  diclofenac (VOLTAREN) 50 MG EC tablet Take 50 mg by mouth 2 (two) times daily. Usually only takes once daily   Yes [provider]  doxycycline (VIBRAMYCIN) 100 MG capsule Take 100 mg by mouth 2 (two) times daily as needed for skin breakdown. 07/05/22  Yes [provider]  NON FORMULARY Cream for lips- hasn't used in 10 days   Yes [provider]  raloxifene (EVISTA) 60 MG tablet Take  60 mg by mouth daily.   Yes [provider]  rosuvastatin (CRESTOR) 10 MG tablet Take 5 mg by mouth daily.   Yes [provider]  Clindamycin-Benzoyl Per, Refr, gel Apply 1 Application topically at bedtime as needed. 02/21/22   [provider]    Current Outpatient Medications  Medication Sig Dispense Refill   BIOTIN PO Take 1 tablet by mouth daily at 6 (six) AM.     Calcium Carb-Cholecalciferol (CALCIUM 1000 + D PO) Take 2 tablets by mouth daily at 6 (six) AM.     Cholecalciferol (VITAMIN D3 PO) Take 1 tablet by mouth daily at 6 (six) AM.     Coenzyme Q10 (COQ10 PO) Take 1 tablet by mouth daily at 6 (six) AM.     diclofenac (VOLTAREN) 50 MG EC tablet Take 50 mg by mouth 2 (two) times daily. Usually only takes once daily     doxycycline (VIBRAMYCIN) 100 MG capsule Take 100 mg by mouth 2 (two) times daily as needed for skin breakdown.     NON FORMULARY Cream for lips- hasn't used in 10 days     raloxifene (EVISTA) 60 MG tablet Take 60 mg by mouth daily.     rosuvastatin (CRESTOR) 10 MG tablet Take 5 mg by mouth daily.     Clindamycin-Benzoyl Per, Refr, gel Apply 1 Application topically at  bedtime as needed.     Current Facility-Administered Medications  Medication Dose Route Frequency Provider Last Rate Last Admin   0.9 %  sodium chloride infusion  500 mL Intravenous Once Leyda Vanderwerf, Carlota Raspberry, MD        Allergies as of 08/14/2022   (No Known Allergies)    Family History  Problem Relation Age of Onset   Breast cancer Mother 54   Melanoma Sister 55   Colon cancer Neg Hx    Colon polyps Neg Hx    Esophageal cancer Neg Hx    Rectal cancer Neg Hx    Stomach cancer Neg Hx     Social History   Socioeconomic History   Marital status: Married    Spouse name: Not on file   Number of children: 2   Years of education: Not on file   Highest education level: Not on file  Occupational History   Occupation: Mudlogger of operations  Tobacco Use   Smoking status:  Former    Years: 3.00    Types: Cigarettes    Quit date: 1985    Years since quitting: 38.8   Smokeless tobacco: Never  Vaping Use   Vaping Use: Never used  Substance and Sexual Activity   Alcohol use: Yes    Comment: rarely, less than weekly   Drug use: No   Sexual activity: Not on file  Other Topics Concern   Not on file  Social History Narrative   Not on file   Social Determinants of Health   Financial Resource Strain: Not on file  Food Insecurity: Not on file  Transportation Needs: Not on file  Physical Activity: Not on file  Stress: Not on file  Social Connections: Not on file  Intimate Partner Violence: Not on file    Review of Systems: All other review of systems negative except as mentioned in the HPI.  Physical Exam: Vital signs BP 139/73   Pulse 90   Temp 97.9 F (36.6 C)   Ht _0  (1.651 m)   Wt 114 lb (51.7 kg)   SpO2 100%   BMI 18.97 kg/m   General:   Alert,  Well-developed, pleasant and cooperative in NAD Lungs:  Clear throughout to auscultation.   Heart:  Regular rate and rhythm Abdomen:  Soft, nontender and nondistended.   Neuro/Psych:  Alert and cooperative. Normal mood and affect. A and O x 3  Jolly Mango, MD [facility]

## 2022-08-14 NOTE — Patient Instructions (Signed)
YOU HAD AN ENDOSCOPIC PROCEDURE TODAY AT [facility]:   Refer to the procedure report that was given to you for any specific questions about what was found during the examination.  If the procedure report does not answer your questions, please call your gastroenterologist to clarify.  If you requested that your care partner not be given the details of your procedure findings, then the procedure report has been included in a sealed envelope for you to review at your convenience later.  YOU SHOULD EXPECT: Some feelings of bloating in the abdomen. Passage of more gas than usual.  Walking can help get rid of the air that was put into your GI tract during the procedure and reduce the bloating. If you had a lower endoscopy (such as a colonoscopy or flexible sigmoidoscopy) you may notice spotting of blood in your stool or on the toilet paper. If you underwent a bowel prep for your procedure, you may not have a normal bowel movement for a few days.  Please Note:  You might notice some irritation and congestion in your nose or some drainage.  This is from the oxygen used during your procedure.  There is no need for concern and it should clear up in a day or so.  SYMPTOMS TO REPORT IMMEDIATELY:  Following lower endoscopy (colonoscopy or flexible sigmoidoscopy):  Excessive amounts of blood in the stool  Significant tenderness or worsening of abdominal pains  Swelling of the abdomen that is new, acute  Fever of 100F or higher   For urgent or emergent issues, a gastroenterologist can be reached at any hour by calling [phone]. Do not use MyChart messaging for urgent concerns.    DIET:  We do recommend a small meal at first, but then you may proceed to your regular diet.  Drink plenty of fluids but you should avoid alcoholic beverages for 24 hours.  MEDICATIONS: Continue present medications.  Please see handouts given to you by your recovery nurse: Polyps and Diverticulosis.  Thank  you for allowing [place] to provide for your healthcare needs today.  ACTIVITY:  You should plan to take it easy for the rest of today and you should NOT DRIVE or use heavy machinery until tomorrow (because of the sedation medicines used during the test).    FOLLOW UP: Our staff will call the number listed on your records the next business day following your procedure.  We will call around 7:15- 8:00 am to check on you and address any questions or concerns that you may have regarding the information given to you following your procedure. If we do not reach you, we will leave a message.     If any biopsies were taken you will be contacted by phone or by letter within the next 1-3 weeks.  Please call us at [phone] if you have not heard about the biopsies in 3 weeks.    SIGNATURES/CONFIDENTIALITY: You and/or your care partner have signed paperwork which will be entered into your electronic medical record.  These signatures attest to the fact that that the information above on your After Visit Summary has been reviewed and is understood.  Full responsibility of the confidentiality of this discharge information lies with you and/or your care-partner.

## 2022-08-17 ENCOUNTER — Telehealth: Payer: Self-pay

## 2022-08-17 NOTE — Telephone Encounter (Signed)
  Follow up Call-     08/14/2022    7:16 AM 04/17/2020    7:21 AM  Call back number  Post procedure Call Back phone  # [phone]  Permission to leave phone message Yes Yes     Patient questions:  Do you have a fever, pain , or abdominal swelling? No. Pain Score  0 *  Have you tolerated food without any problems? Yes.    Have you been able to return to your normal activities? Yes.    Do you have any questions about your discharge instructions: Diet   No. Medications  No. Follow up visit  No.  Do you have questions or concerns about your Care? No.  Actions: * If pain score is 4 or above: No action needed, pain <4.

## 2022-11-24 IMAGING — US US RENAL
1 series · 14 of 25 positions shown · non-contrast
Comparison: CT abdomen pelvis dated February 14, 2020.

CLINICAL DATA: Follow-up right renal cyst seen on CT.

EXAM:
RENAL / URINARY TRACT ULTRASOUND COMPLETE

[Series 1: us renal · 14 of 38 slices shown]
[im 1/38]
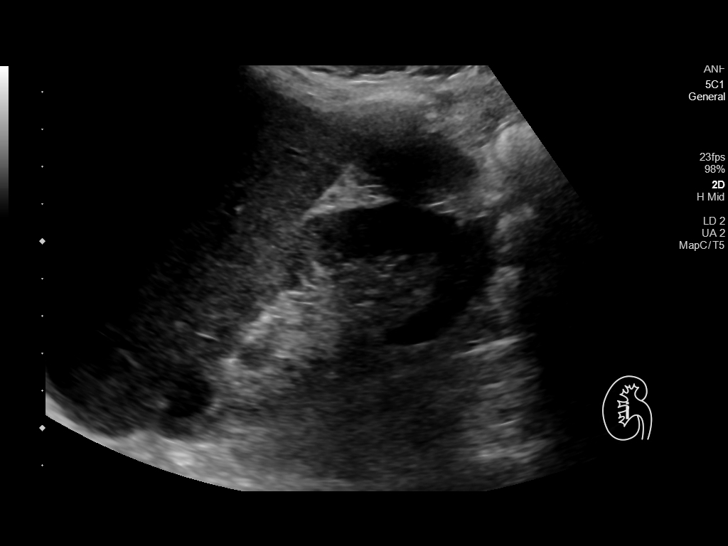
[im 4/38]
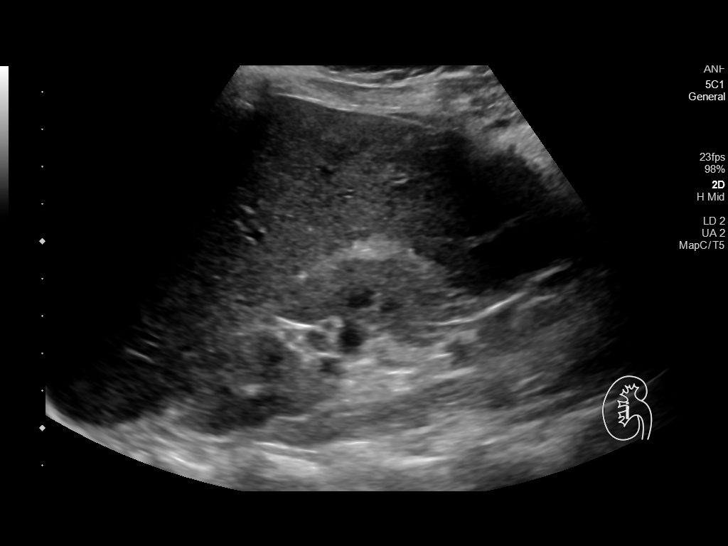
[im 7/38]
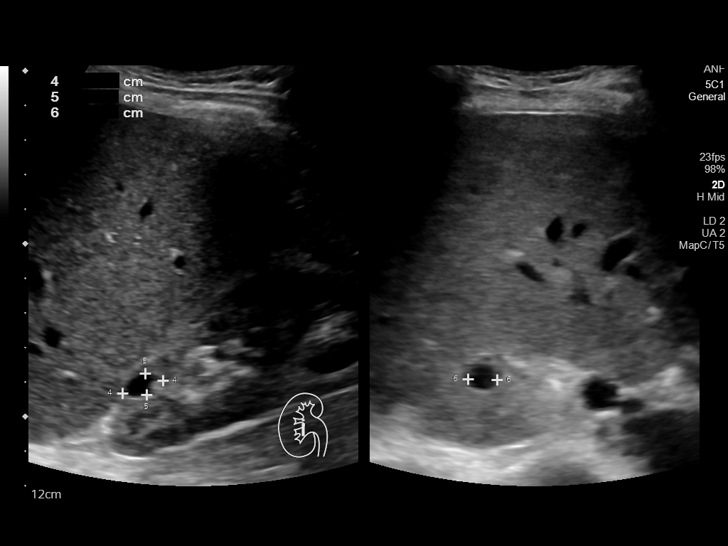
[im 10/38]
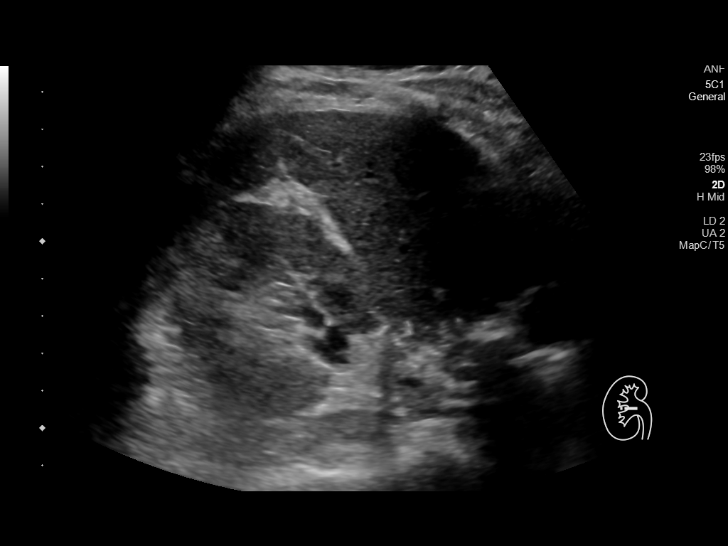
[im 13/38]
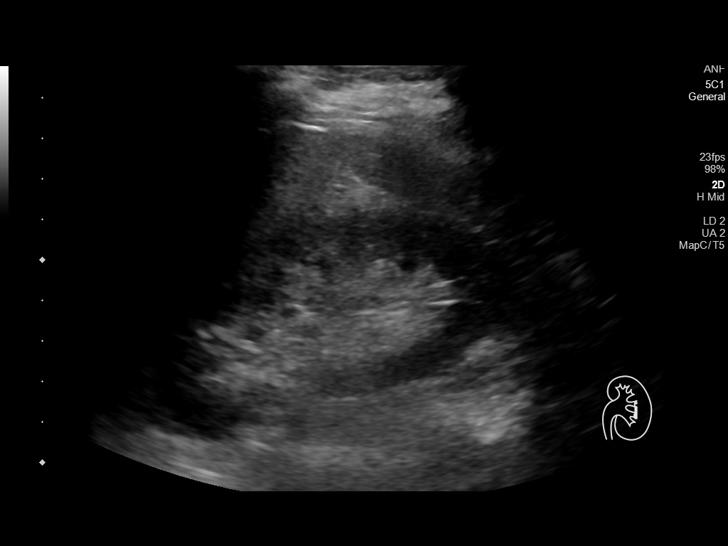
[im 14/38]
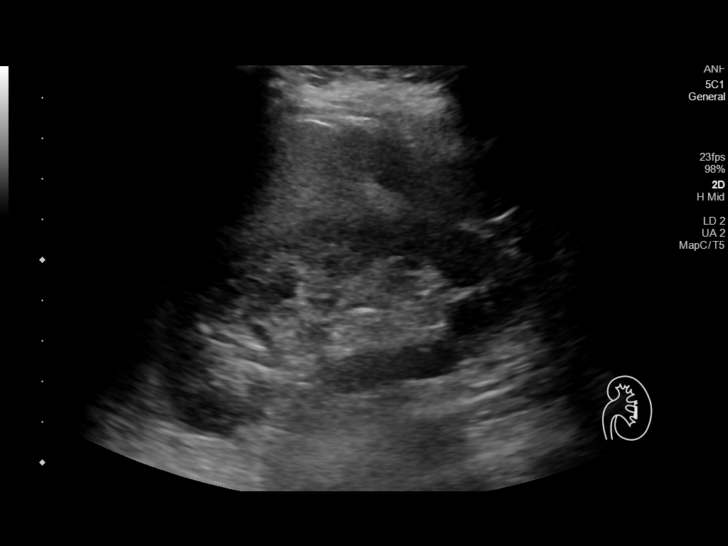
[im 17/38]
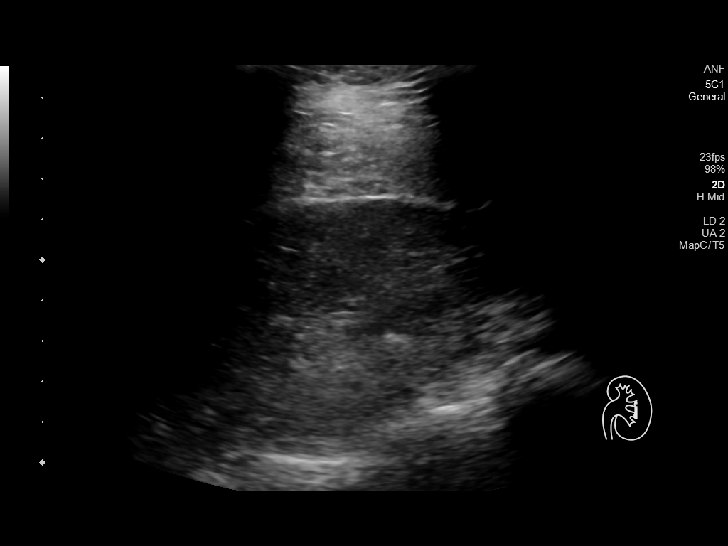
[im 21/38]
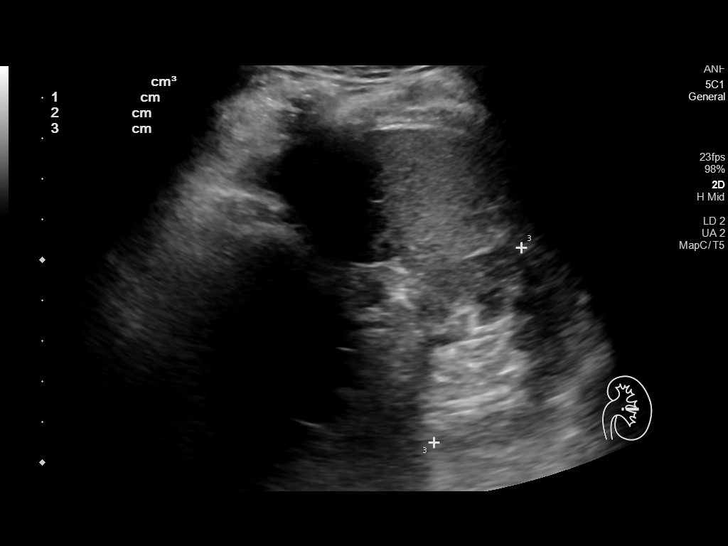
[im 24/38]
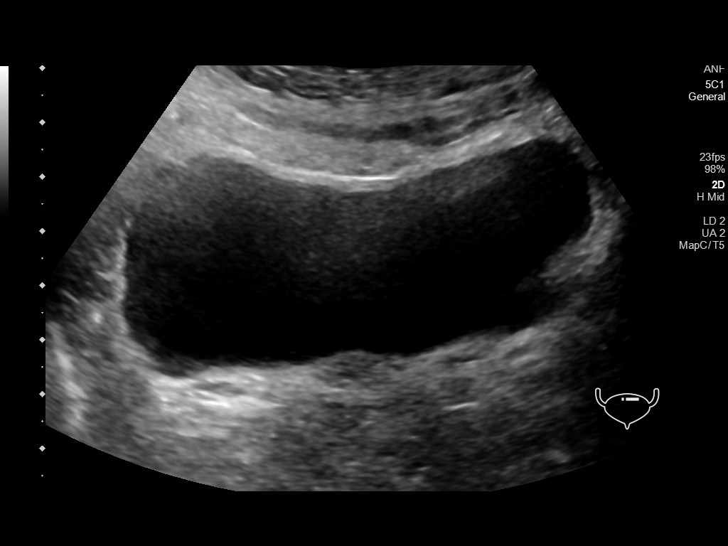
[im 25/38]
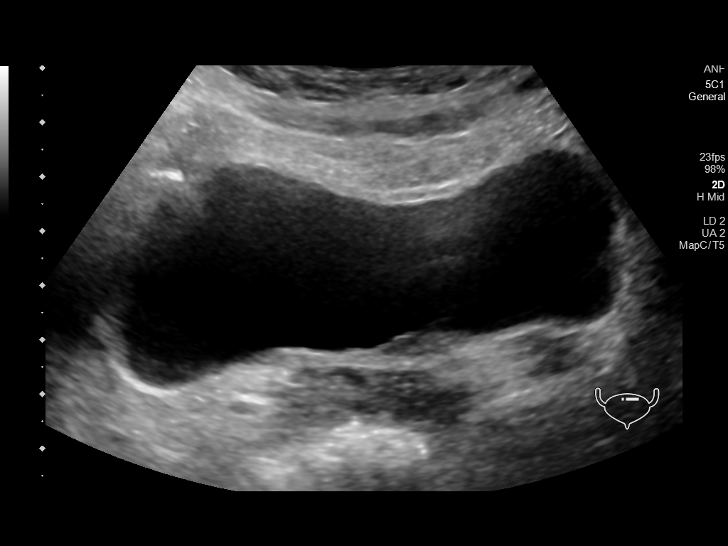
[im 28/38]
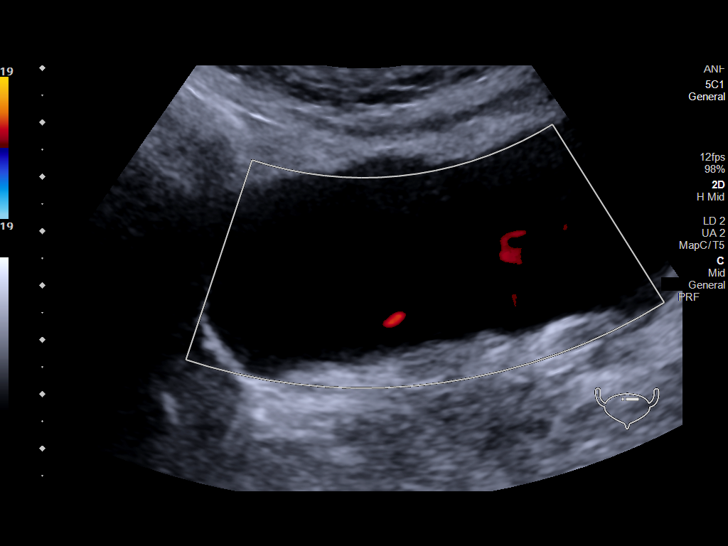
[im 31/38]
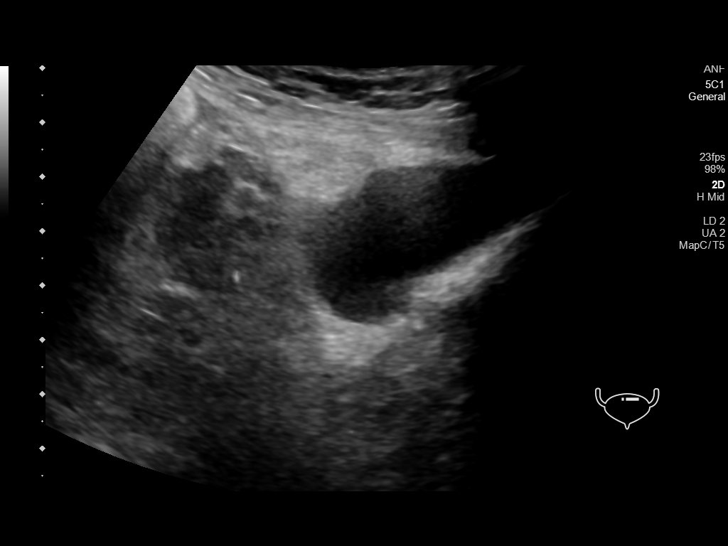
[im 34/38]
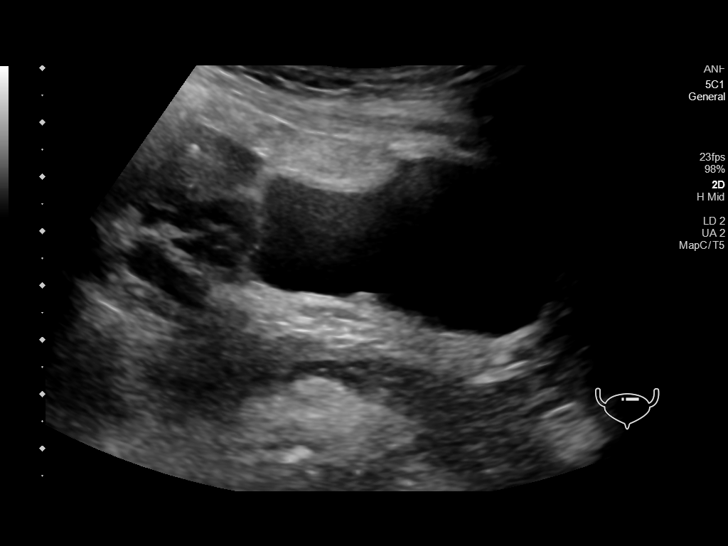
[im 38/38]
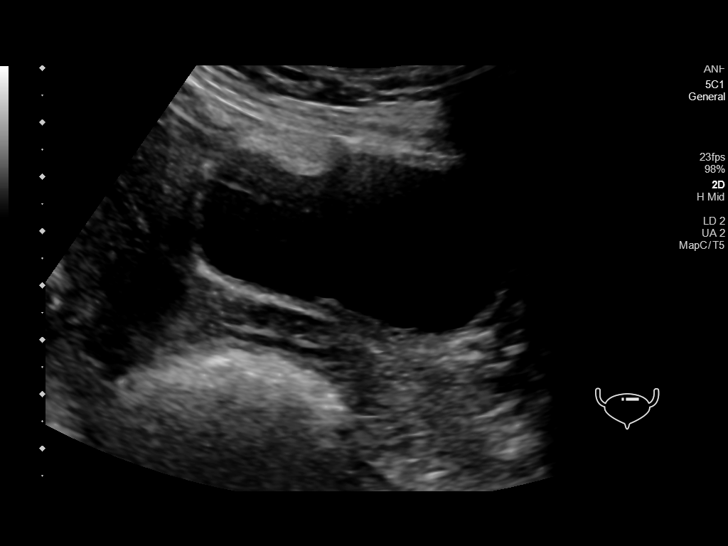

[14 of 25 positions shown; findings below may reference images not displayed]

FINDINGS: Right Kidney:

Renal measurements: 10.4 x 4.3 x 5.7 cm = volume: 132 mL.
Echogenicity within normal limits. No mass or hydronephrosis
visualized. 1.2 cm simple cyst in the upper pole.

Left Kidney:

Renal measurements: 10.4 x 5.3 x 5.3 cm = volume: 150 mL.
Echogenicity within normal limits. No mass or hydronephrosis
visualized. Known 1.9 cm angiomyolipoma is less conspicuous on
ultrasound.

Bladder:

Appears normal for degree of bladder distention.

Other:

None.
IMPRESSION: 1. Right renal lesion seen on prior CT corresponds to a 1.2 cm
simple cyst. No follow-up required.
2. Known 1.9 cm left renal angiomyolipoma is less conspicuous on
ultrasound.

## 2022-12-09 DIAGNOSIS — L988 Other specified disorders of the skin and subcutaneous tissue: Secondary | ICD-10-CM | POA: Insufficient documentation

## 2023-03-02 ENCOUNTER — Telehealth: Payer: Self-pay | Admitting: Gastroenterology

## 2023-03-02 DIAGNOSIS — R1084 Generalized abdominal pain: Secondary | ICD-10-CM

## 2023-03-02 DIAGNOSIS — R110 Nausea: Secondary | ICD-10-CM

## 2023-03-02 NOTE — Telephone Encounter (Signed)
Thanks [employer]. Yes may be good to do CBC and CMET prior to her visit if she can. Thanks

## 2023-03-02 NOTE — Telephone Encounter (Signed)
Patient called states she has been having a lot of paper thin bowels and thinks she has a blockage. She said she is feeling really sick also has a low grade fever. Seeking advise.

## 2023-03-02 NOTE — Telephone Encounter (Signed)
Returned call to patient. Pt states that her issues started about 3-weeks ago. She felt like "something was stuck under ribcage", then she experienced excruciating pain for about 12 hours. Pt reports that she has been having a hard time emptying her bowels completely. Pt report stomach cramps that were intermittent, that have become consistent since last night. Pt describes her stools as "thin like a pencil". Pt has some nausea, but no vomiting. Pt had chills, her temp was 99.3. Patient states that she has been taking prescribed Maalox for 17 days. There was a cancellation for an appt with Willette Cluster, NP tomorrow at 3 pm, I scheduled the pt for this appt. Do you want her to come in for labs prior to appt?

## 2023-03-02 NOTE — Telephone Encounter (Signed)
Called and informed pt to come by for labs at 2 pm and they should be resulted by the time of her appt with [facility] tomorrow afternoon. Pt verbalized understanding.  Lab orders in epic.

## 2023-03-03 ENCOUNTER — Other Ambulatory Visit (INDEPENDENT_AMBULATORY_CARE_PROVIDER_SITE_OTHER): Payer: Medicare Other

## 2023-03-03 ENCOUNTER — Ambulatory Visit (INDEPENDENT_AMBULATORY_CARE_PROVIDER_SITE_OTHER): Payer: Medicare Other | Admitting: Nurse Practitioner

## 2023-03-03 ENCOUNTER — Encounter: Payer: Self-pay | Admitting: Nurse Practitioner

## 2023-03-03 VITALS — BP 96/70 | HR 95 | Ht 65.0 in | Wt 107.4 lb

## 2023-03-03 DIAGNOSIS — R110 Nausea: Secondary | ICD-10-CM

## 2023-03-03 DIAGNOSIS — R1084 Generalized abdominal pain: Secondary | ICD-10-CM

## 2023-03-03 DIAGNOSIS — R1010 Upper abdominal pain, unspecified: Secondary | ICD-10-CM

## 2023-03-03 DIAGNOSIS — R1030 Lower abdominal pain, unspecified: Secondary | ICD-10-CM | POA: Diagnosis not present

## 2023-03-03 LAB — COMPREHENSIVE METABOLIC PANEL
ALT: 12 U/L (ref 0–35)
AST: 17 U/L (ref 0–37)
Albumin: 4.3 g/dL (ref 3.5–5.2)
Alkaline Phosphatase: 49 U/L (ref 39–117)
BUN: 15 mg/dL (ref 6–23)
CO2: 26 mEq/L (ref 19–32)
Calcium: 9.8 mg/dL (ref 8.4–10.5)
Chloride: 101 mEq/L (ref 96–112)
Creatinine, Ser: 0.86 mg/dL (ref 0.40–1.20)
GFR: 70.92 mL/min (ref >60.00)
Glucose, Bld: 75 mg/dL (ref 70–99)
Potassium: 4.3 mEq/L (ref 3.5–5.1)
Sodium: 137 mEq/L (ref 135–145)
Total Bilirubin: 0.7 mg/dL (ref 0.2–1.2)
Total Protein: 7.2 g/dL (ref 6.0–8.3)

## 2023-03-03 LAB — CBC WITH DIFFERENTIAL/PLATELET
Basophils Absolute: 0.1 10*3/uL (ref 0.0–0.1)
Basophils Relative: 0.9 % (ref 0.0–3.0)
Eosinophils Absolute: 0 10*3/uL (ref 0.0–0.7)
Eosinophils Relative: 0.3 % (ref 0.0–5.0)
HCT: 40.5 % (ref 36.0–46.0)
Hemoglobin: 13.5 g/dL (ref 12.0–15.0)
Lymphocytes Relative: 17.2 % (ref 12.0–46.0)
Lymphs Abs: 1.5 10*3/uL (ref 0.7–4.0)
MCHC: 33.4 g/dL (ref 30.0–36.0)
MCV: 87.7 fl (ref 78.0–100.0)
Monocytes Absolute: 0.5 10*3/uL (ref 0.1–1.0)
Monocytes Relative: 6.1 % (ref 3.0–12.0)
Neutro Abs: 6.4 10*3/uL (ref 1.4–7.7)
Neutrophils Relative %: 75.5 % (ref 43.0–77.0)
Platelets: 214 10*3/uL (ref 150.0–400.0)
RBC: 4.62 Mil/uL (ref 3.87–5.11)
RDW: 13.3 % (ref 11.5–15.5)
WBC: 8.5 10*3/uL (ref 4.0–10.5)

## 2023-03-03 MED ORDER — CIPROFLOXACIN HCL 500 MG PO TABS: 500.0000 mg | ORAL_TABLET | Freq: Two times a day (BID) | ORAL | 0 refills | Status: AC

## 2023-03-03 MED ORDER — METRONIDAZOLE 500 MG PO TABS: 500.0000 mg | ORAL_TABLET | Freq: Three times a day (TID) | ORAL | 0 refills | Status: AC

## 2023-03-03 MED ORDER — DICYCLOMINE HCL 10 MG PO CAPS: 10.0000 mg | ORAL_CAPSULE | Freq: Two times a day (BID) | ORAL | 3 refills | Status: DC

## 2023-03-03 MED ORDER — OMEPRAZOLE 40 MG PO CPDR: 40.0000 mg | DELAYED_RELEASE_CAPSULE | Freq: Every day | ORAL | 3 refills | Status: DC

## 2023-03-03 NOTE — Progress Notes (Signed)
Assessment and Plan   Primary GI: Ileene Patrick, MD  Brief Narrative:  67 y.o. yo female whose past medical history includes but is not necessarily limited to diverticulitis, colon polyps  Epigastric pain with occasional nausea x 1 month. Pain exacerbated by eating. Etiology unclear but need to consider PUD in setting of daily Diclofenac.  Some recent improvement in pain compared to when it started last month.  -Advised to discontinue diclofenac for now,  or at least use as sparingly as possible. -Start omeprazole 40 mg every morning -Follow-up with me at next available appointment which unfortunately is the end of July.  In the interim, she will call if not improving.  Low threshold to proceed with EGD and if negative then consider abdominal imaging.   Intermittent lower abdominal pain over the last month with acute worsening of pain and also fever of 101 last night.  She has recently been struggling with small caliber stools / constipation.  While her pain could be secondary to constipation I am concerned about another bout of diverticulitis given the fever.   Afebrile, non-toxic appearing and not significantly tender on exam.  She is up-to-date on her colonoscopy, last one was October 2023 Plan:  -MiraLAX bowel purge this evening.  Following that, if pain persist then she will take Cipro and Flagyl x 7 days for possible diverticulitis.  -Dicyclomine 10 mg BID as needed for abdominal pain , # 30, 1 refill.  -On Friday she will call [phone] and give Arlyss Queen, LPN acondition update.    History of Present Illness   Chief complaint: abdominal pain    Over the last month Alexis Cooper has been having both upper and lower abdominal pain though the two. seem to be unrelated.   Upper abdominal pain began about a month ago with severe epigastric pain exacerbated by eating.  She felt the intense pain immediately after swallowing.  She had also been noticing that her stools were smaller  in caliber. She went to Urgent Care in [place] on 4/27.  There she was told that she was possibly constipated and was prescribed several days of daily MiraLAX.  She really did not notice any change in her bowel movements taking the MiraLAX .  She still feels constipated .  However the severe epigastric pain has improved though not resolved.  Of note, she has been taking 1/2 of a voltaren every day for 6 years.     Over the last month or so Alexis Cooper has also been having occasional lower abdominal pain which last night became excruciating .  Last night she also had chills and a temp of 101.  She describes generalized lower abdominal pressure and cramping.  No dysuria but last night she was having frequent urination. She does not feel like she has a UTI.  Rather she believes her bowel was somehow putting pressure on her bladder. Today she is afebrile.  No longer having frequent urination.    Alexis Cooper has been under a lot of stress lately taking care of elderly parents.    Previous GI Endoscopies / Labs / Imaging   Oct 2023 surveillance colonoscopy - Tortuous colon. - One 4 mm polyp in the sigmoid colon, removed with a cold snare. Resected and retrieved. - Diverticulosis in the sigmoid colon.     Latest Ref Rng & Units 02/14/2020   10:53 AM  Hepatic Function  Total Protein 6.0 - 8.3 g/dL 7.5   Albumin 3.5 - 5.2 g/dL 4.6  AST 0 - 37 U/L 17   ALT 0 - 35 U/L 12   Alk Phosphatase 39 - 117 U/L 54   Total Bilirubin 0.2 - 1.2 mg/dL 0.5        Latest Ref Rng & Units 02/14/2020   10:53 AM  CBC  WBC 4.0 - 10.5 K/uL 6.1   Hemoglobin 12.0 - 15.0 g/dL 16.1   Hematocrit 09.6 - 46.0 % 38.9   Platelets 150.0 - 400.0 K/uL 214.0      Past Medical History:  Diagnosis Date   Chronic neck pain    Hyperlipemia    on meds   Osteoporosis    bone scan confirmation per pt    Past Surgical History:  Procedure Laterality Date   COLONOSCOPY  08/18/2017   SA-MAC-suprep(good)-tics/hems/touruous colon/SSP x  1   POLYPECTOMY  2018   SSP   TONSILLECTOMY     WISDOM TOOTH EXTRACTION      Current Medications, Allergies, Family History and Social History were reviewed in [employer] record.     Current Outpatient Medications  Medication Sig Dispense Refill   BIOTIN PO Take 1 tablet by mouth daily at 6 (six) AM.     Calcium Carb-Cholecalciferol (CALCIUM 1000 + D PO) Take 2 tablets by mouth daily at 6 (six) AM.     Cholecalciferol (VITAMIN D3 PO) Take 1 tablet by mouth daily at 6 (six) AM.     Coenzyme Q10 (COQ10 PO) Take 1 tablet by mouth daily at 6 (six) AM.     diclofenac (VOLTAREN) 50 MG EC tablet Take 50 mg by mouth 2 (two) times daily. Usually only takes once daily     raloxifene (EVISTA) 60 MG tablet Take 60 mg by mouth daily.     rosuvastatin (CRESTOR) 10 MG tablet Take 5 mg by mouth daily.     Current Facility-Administered Medications  Medication Dose Route Frequency Provider Last Rate Last Admin   0.9 %  sodium chloride infusion  500 mL Intravenous Once Armbruster, Willaim Rayas, MD        Review of Systems: No chest pain. No shortness of breath. No urinary complaints.    Physical Exam  Wt Readings from Last 3 Encounters:  03/03/23 107 lb 6 oz (48.7 kg)  08/14/22 114 lb (51.7 kg)  07/13/22 114 lb (51.7 kg)    BP 96/70   Pulse 95   Ht 5\' 5"  (1.651 m)   Wt 107 lb 6 oz (48.7 kg)   BMI 17.87 kg/m  Constitutional:  Pleasant, generally well appearing thin female in no acute distress. Psychiatric: Normal mood and affect. Behavior is normal. EENT: Pupils normal.  Conjunctivae are normal. No scleral icterus. Neck supple.  Cardiovascular: Normal rate, regular rhythm.  Pulmonary/chest: Effort normal and breath sounds normal. No wheezing, rales or rhonchi. Abdominal: Soft, nondistended, mild left mid and LLQ tenderness. Bowel sounds active throughout. There are no masses palpable.. Neurological: Alert and oriented to person place and time.  Skin: Skin is warm  and dry. No rashes noted.  Willette Cluster, NP  03/03/2023, 2:34 PM

## 2023-03-03 NOTE — Progress Notes (Signed)
Agree with assessment and plan as outlined.  

## 2023-03-03 NOTE — Patient Instructions (Addendum)
_______________________________________________________  If your blood pressure at your visit was 140/90 or greater, please contact your primary care physician to follow up on this. _______________________________________________________  If you are age 65 or older, your body mass index should be between 23-30. Your Body mass index is 17.87 kg/m. If this is out of the aforementioned range listed, please consider follow up with your Primary Care Provider. ________________________________________________________  The [facility] providers would like to encourage you to use [facility] to communicate with providers for non-urgent requests or questions.  Due to long hold times on the telephone, sending your provider a message by [facility] may be a faster and more efficient way to get a response.  Please allow 48 business hours for a response.  Please remember that this is for non-urgent requests.  _______________________________________________________  It has been recommended  that you complete a bowel purge (to clean out your bowels). Please do the following:  Purchase a bottle of Miralax over the counter as well as a box of 5 mg dulcolax tablets.  Take 4 dulcolax tablets.  Wait 1 hour.  You will then drink 6-8 capfuls of Miralax mixed in an adequate amount of water/juice/gatorade (you may choose which of these liquids to drink) over the next 2-3 hours.  You should expect results within 1 to 6 hours after completing the bowel purge.  We have sent the following medications to your pharmacy for you to pick up at your convenience:  START: dicyclomine 10mg  one tablet twice daily for abdominal pain.  START: omeprazole 40mg  one capsule every morning shortly before breakfast.  If lower abdominal pain doesn't resolve then: START: Cipro 500mg  one tablet two times daily for 7 days. START: Flagyl 500 mg one tablet three times daily for 7 days.  Please try not to use Voltaren for now.  On Friday call  [phone] and ask to speak with [facility], LPN to give  a condition update.   You are scheduled to follow up in our office on 7--31-24 at 3pm.  Thank you for entrusting me with your care and choosing [facility].  Willette Cluster, NP

## 2023-03-10 ENCOUNTER — Other Ambulatory Visit: Payer: Self-pay | Admitting: Nurse Practitioner

## 2023-04-01 DIAGNOSIS — N6099 Unspecified benign mammary dysplasia of unspecified breast: Secondary | ICD-10-CM | POA: Insufficient documentation

## 2023-04-01 DIAGNOSIS — M8580 Other specified disorders of bone density and structure, unspecified site: Secondary | ICD-10-CM | POA: Insufficient documentation

## 2023-04-20 ENCOUNTER — Telehealth (HOSPITAL_BASED_OUTPATIENT_CLINIC_OR_DEPARTMENT_OTHER): Payer: Self-pay | Admitting: Cardiology

## 2023-04-20 ENCOUNTER — Encounter (HOSPITAL_BASED_OUTPATIENT_CLINIC_OR_DEPARTMENT_OTHER): Payer: Self-pay

## 2023-04-20 NOTE — Telephone Encounter (Signed)
Left message for patient regarding appointment date change for Dr.Christopher---Friday 06/11/23 at 9:40 am (Dr. Cristal Deer not in office) has been changed to Friday 06/18/23 at 9:40 m.  Will send My Chart message as well

## 2023-04-23 ENCOUNTER — Ambulatory Visit: Payer: BC Managed Care – PPO | Admitting: Gastroenterology

## 2023-05-19 ENCOUNTER — Ambulatory Visit: Payer: BC Managed Care – PPO | Admitting: Gastroenterology

## 2023-05-19 ENCOUNTER — Ambulatory Visit: Payer: Medicare Other | Admitting: Nurse Practitioner

## 2023-05-29 ENCOUNTER — Other Ambulatory Visit: Payer: Self-pay | Admitting: Nurse Practitioner

## 2023-06-11 ENCOUNTER — Ambulatory Visit (HOSPITAL_BASED_OUTPATIENT_CLINIC_OR_DEPARTMENT_OTHER): Payer: Medicare Other | Admitting: Cardiology

## 2023-06-18 ENCOUNTER — Ambulatory Visit (HOSPITAL_BASED_OUTPATIENT_CLINIC_OR_DEPARTMENT_OTHER): Payer: Medicare Other | Admitting: Cardiology

## 2023-06-30 ENCOUNTER — Ambulatory Visit (INDEPENDENT_AMBULATORY_CARE_PROVIDER_SITE_OTHER): Payer: Medicare Other | Admitting: Cardiology

## 2023-06-30 ENCOUNTER — Encounter (HOSPITAL_BASED_OUTPATIENT_CLINIC_OR_DEPARTMENT_OTHER): Payer: Self-pay | Admitting: Cardiology

## 2023-06-30 VITALS — BP 104/72 | HR 64 | Ht 65.0 in | Wt 112.6 lb

## 2023-06-30 DIAGNOSIS — E7800 Pure hypercholesterolemia, unspecified: Secondary | ICD-10-CM | POA: Diagnosis not present

## 2023-06-30 DIAGNOSIS — Z7189 Other specified counseling: Secondary | ICD-10-CM

## 2023-06-30 DIAGNOSIS — I700 Atherosclerosis of aorta: Secondary | ICD-10-CM

## 2023-06-30 NOTE — Progress Notes (Signed)
Cardiology Office Note:  .    Date:  06/30/2023  ID:  Alexis Cooper, DOB 01/05/1956, MRN [id number] PCP: Maximiano Coss, MD  [facility] Providers Cardiologist:  Jodelle Red, MD     History of Present Illness: .    Alexis Cooper is a 67 y.o. female with a hx of aortic atherosclerosis, hyperlipidemia, here for the evaluation of high cholesterol. She is self-referred.  Previously followed by Dr. Altamease Oiler with [facility] in [place], [place].  Cardiovascular risk factors: Prior clinical ASCVD: Never had heart attack or stroke. Aortic atherosclerosis noted on abdominal/pelvic CT 01/2020 (evaluated for diverticulitis at the time). Comorbid conditions: She confirms a history of hyperlipidemia, which she feels is familial. She has been on and off statin therapy for the last 15 years, but has been on rosuvastatin consistently for the past 3 years. Currently she is on 5 mg rosuvastatin daily. She reports that her highest total cholesterol levels were 280-300, now closer to 200. Metabolic syndrome/Obesity:  Highest adult weight 125 lbs. Current weight 112 lbs. Chronic inflammatory conditions: None. However, she complains of a lot of generic aches and pains. Occasionally has muscle cramping in her feet. She believes she has arthritis but has never needed steriodal treatments. 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 Family history: Her father is living at 75 yo, received a pacemaker and has lived with it for 6 years. Her mother is living at 55 yo without any heart issues, had prior mini strokes, prior breast cancer. Her maternal grandfather may have died of a heart attack. Her sister had melanoma.  Prior pertinent testing and/or incidental findings: About 15 years ago she did have a stress test. Exercise level: She used to be a swimmer, 5 days per week until 6 years ago when she injured her arm. She continues to exercise on a daily basis, minimum of 1 hour per  day. In the past 6 months she has started some weight training for 10 min sessions, 6 days per week. She also walks 3 miles a day. Has been participating in pilates for 3 months. She has noticed appropriate shortness of breath when feeling fatigued or going up steep hills. Current diet: She admits to not always following a healthy diet. She has been working on reducing junk food (enjoys chips), sugars, carbs.  She denies any palpitations, chest pain, peripheral edema, lightheadedness, headaches, syncope, orthopnea, or PND.  ROS:  Please see the history of present illness. ROS otherwise negative except as noted.  (+) Generic Myalgias/Arthralgias (+) Exertional shortness of breath  Studies Reviewed: Marland Kitchen    EKG Interpretation Date/Time:  Wednesday June 30 2023 09:22:19 EDT Ventricular Rate:  64 PR Interval:  128 QRS Duration:  72 QT Interval:  404 QTC Calculation: 416 R Axis:   28  Text Interpretation: Normal sinus rhythm Septal infarct , age undetermined No previous ECGs available Confirmed by Jodelle Red [phone]) on 06/30/2023 9:27:30 AM    CT Abdomen/Pelvis  02/14/2020: IMPRESSION: 1. Non complicated sigmoid diverticulitis. Isolated inflamed diverticulum position posterior to the sigmoid. 2. Upper pole right renal lesion which is technically too small to characterize but not a simple cyst. Consider follow-up with pre and post contrast abdominal MRI at 6-12 months. 3. Branching opacity at the right lung base, most likely an area of mucoid impaction, typically post infectious or inflammatory. Consider further evaluation with dedicated chest CT to serve as a baseline and allow surveillance. 4.  Aortic Atherosclerosis (ICD10-I70.0). 5. 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 6. Left renal angiomyolipoma.  Physical Exam:    VS:  BP 104/72 (BP Location: Right Arm, Patient Position: Sitting, Cuff Size: Normal)   Pulse 64   Ht 5\' 5"  (1.651  m)   Wt 112 lb 9.6 oz (51.1 kg)   BMI 18.74 kg/m    Wt Readings from Last 3 Encounters:  06/30/23 112 lb 9.6 oz (51.1 kg)  03/03/23 107 lb 6 oz (48.7 kg)  08/14/22 114 lb (51.7 kg)    GEN: Well nourished, well developed in no acute distress HEENT: Normal, moist mucous membranes NECK: No JVD CARDIAC: regular rhythm, normal S1 and S2, no rubs or gallops. No murmur. VASCULAR: Radial and DP pulses 2+ bilaterally. No carotid bruits RESPIRATORY:  Clear to auscultation without rales, wheezing or rhonchi  ABDOMEN: Soft, non-tender, non-distended MUSCULOSKELETAL:  Ambulates independently SKIN: Warm and dry, no edema NEUROLOGIC:  Alert and oriented x 3. No focal neuro deficits noted. PSYCHIATRIC:  Normal affect   ASSESSMENT AND PLAN: .    Aortic atherosclerosis Hypercholesterolemia -discussed pathology of cholesterol, how plaques form, that MI/CVA result commonly from acute plaque rupture and not gradual stenosis. Discussed mechanism of statin to both decrease plaque accumulation and stabilize plaque that is already present. Discussed that calcium is a marker for plaque -We will continue on rosuvastatin 5 mg daily. We will provide lab slips for lipid check and she will check at home for records.  CV risk counseling and prevention -recommend heart healthy/Mediterranean diet, with whole grains, fruits, vegetable, fish, lean meats, nuts, and olive oil. Limit salt. -recommend moderate walking, 3-5 times/week for 30-50 minutes each session. Aim for at least 150 minutes.week. Goal should be pace of 3 miles/hours, or walking 1.5 miles in 30 minutes -recommend avoidance of tobacco products. Avoid excess alcohol. -ASCVD risk score:  Dispo: Follow-up in 1 year per patient preference, or sooner as needed.  I,Mathew Stumpf,acting as a Neurosurgeon for [employer], MD.,have documented all relevant documentation on the behalf of Jodelle Red, MD,as directed by  Jodelle Red, MD  while in the presence of Jodelle Red, MD.  I, Jodelle Red, MD, have reviewed all documentation for this visit. The documentation on 06/30/23 for the exam, diagnosis, procedures, and orders are all accurate and complete.   Signed, Jodelle Red, MD

## 2023-06-30 NOTE — Patient Instructions (Signed)
Medication Instructions:  Your physician recommends that you continue on your current medications as directed. Please refer to the Current Medication list given to you today.   *If you need a refill on your cardiac medications before your next appointment, please call your pharmacy*  Lab Work: FASTING LIPIDS SOON   If you have labs (blood work) drawn today and your tests are completely normal, you will receive your results only by: MyChart Message (if you have MyChart) OR A paper copy in the mail If you have any lab test that is abnormal or we need to change your treatment, we will call you to review the results.  Testing/Procedures: NONE   Follow-Up: At [facility], you and your health needs are our priority.  As part of our continuing mission to provide you with exceptional heart care, we have created designated Provider Care Teams.  These Care Teams include your primary Cardiologist (physician) and Advanced Practice Providers (APPs -  Physician Assistants and Nurse Practitioners) who all work together to provide you with the care you need, when you need it.  We recommend signing up for the patient portal called "MyChart".  Sign up information is provided on this After Visit Summary.  MyChart is used to connect with patients for Virtual Visits (Telemedicine).  Patients are able to view lab/test results, encounter notes, upcoming appointments, etc.  Non-urgent messages can be sent to your provider as well.   To learn more about what you can do with MyChart, go to [email or web address].    Your next appointment:   12 month(s)  Provider:   Jodelle Red, MD or Gillian Shields, NP

## 2023-07-01 ENCOUNTER — Encounter (HOSPITAL_BASED_OUTPATIENT_CLINIC_OR_DEPARTMENT_OTHER): Payer: Self-pay

## 2023-07-01 NOTE — Telephone Encounter (Signed)
Please advise 

## 2024-01-18 ENCOUNTER — Telehealth: Payer: Self-pay | Admitting: Family Medicine

## 2024-01-18 NOTE — Telephone Encounter (Signed)
 Copied from CRM [id number]. Topic: Appointments - Scheduling Inquiry for Clinic >> Jan 18, 2024  9:33 AM Martinique E wrote: Reason for CRM: Patient called in wanting to get established with Dr. Lawerance Bach. Relayed to patient that Dr. Lawerance Bach is currently not accepting new patients, but patient was referred to Dr. Lawerance Bach by her friend that sees her, and was told that Dr. Lawerance Bach would take her on. Callback number for patient is [phone] to schedule.  ---  Are you willing to see this NP?

## 2024-01-18 NOTE — Telephone Encounter (Signed)
 Yes, I will accept-she is a friend of Norman Clay

## 2024-01-20 DIAGNOSIS — E785 Hyperlipidemia, unspecified: Secondary | ICD-10-CM | POA: Insufficient documentation

## 2024-01-20 NOTE — Progress Notes (Unsigned)
 Subjective:    Patient ID: Alexis Cooper, female    DOB: [birth year], 68 y.o.   MRN: [id number]     HPI Alexis Cooper is here to establish with a new pcp.  She is here for follow up of her chronic medical problems.  Does have some increased stress.  Her father died and her mom in in memory care in [place].  She has to go back and forth.  Her daughter has twins - 2 yrs old.   She exercises regularly - Walking 3 miles, pilate, lifting weights  Has had some b/l lower back pain when she gets up after sitting for a while which is new  Has increased urination at night and does not always sleep well - does not want to take medication  Medications and allergies reviewed with patient and updated if appropriate.  Current Outpatient Medications on File Prior to Visit  Medication Sig Dispense Refill   BIOTIN PO Take 1 tablet by mouth daily at 6 (six) AM.     Calcium Carb-Cholecalciferol (CALCIUM 1000 + D PO) Take 2 tablets by mouth daily at 6 (six) AM.     Cholecalciferol (VITAMIN D3 PO) Take 1 tablet by mouth daily at 6 (six) AM.     Coenzyme Q10 (COQ10 PO) Take 1 tablet by mouth daily at 6 (six) AM.     raloxifene (EVISTA) 60 MG tablet Take 60 mg by mouth daily.     rosuvastatin (CRESTOR) 10 MG tablet Take 5 mg by mouth daily.     No current facility-administered medications on file prior to visit.     Review of Systems  Constitutional:  Negative for fever.  Respiratory:  Negative for cough, shortness of breath and wheezing.   Cardiovascular:  Negative for chest pain, palpitations and leg swelling.  Gastrointestinal:  Negative for abdominal pain, constipation and diarrhea.       No gerd  Genitourinary:  Positive for frequency. Negative for dysuria.  Musculoskeletal:  Positive for arthralgias (mild).  Neurological:  Positive for headaches (occ - x past 2 days). Negative for light-headedness.       Objective:   Vitals:   01/21/24 1113  BP: 104/70  Pulse: 70  Temp: 98.4 F  (36.9 C)  SpO2: 97%   BP Readings from Last 3 Encounters:  01/21/24 104/70  06/30/23 104/72  03/03/23 96/70   Wt Readings from Last 3 Encounters:  01/21/24 108 lb (49 kg)  06/30/23 112 lb 9.6 oz (51.1 kg)  03/03/23 107 lb 6 oz (48.7 kg)   Body mass index is 17.97 kg/m.    Physical Exam Constitutional:      General: She is not in acute distress.    Appearance: Normal appearance.  HENT:     Head: Normocephalic and atraumatic.  Eyes:     Conjunctiva/sclera: Conjunctivae normal.  Cardiovascular:     Rate and Rhythm: Normal rate and regular rhythm.     Heart sounds: Normal heart sounds.  Pulmonary:     Effort: Pulmonary effort is normal. No respiratory distress.     Breath sounds: Normal breath sounds. No wheezing.  Abdominal:     General: There is no distension.     Palpations: Abdomen is soft.     Tenderness: There is no abdominal tenderness.  Musculoskeletal:        General: No tenderness (lower back - area of pain is SI joints).     Cervical back: Neck supple.     Right  lower leg: No edema.     Left lower leg: No edema.  Lymphadenopathy:     Cervical: No cervical adenopathy.  Skin:    General: Skin is warm and dry.     Findings: No rash.  Neurological:     Mental Status: She is alert. Mental status is at baseline.  Psychiatric:        Mood and Affect: Mood normal.        Behavior: Behavior normal.        Lab Results  Component Value Date   WBC 8.5 03/03/2023   HGB 13.5 03/03/2023   HCT 40.5 03/03/2023   PLT 214.0 03/03/2023   GLUCOSE 75 03/03/2023   ALT 12 03/03/2023   AST 17 03/03/2023   NA 137 03/03/2023   K 4.3 03/03/2023   CL 101 03/03/2023   CREATININE 0.86 03/03/2023   BUN 15 03/03/2023   CO2 26 03/03/2023     Assessment & Plan:    See Problem List for Assessment and Plan of chronic medical problems.

## 2024-01-21 ENCOUNTER — Encounter: Payer: Self-pay | Admitting: Internal Medicine

## 2024-01-21 ENCOUNTER — Ambulatory Visit (INDEPENDENT_AMBULATORY_CARE_PROVIDER_SITE_OTHER): Admitting: Internal Medicine

## 2024-01-21 VITALS — BP 104/70 | HR 70 | Temp 98.4°F | Ht 65.0 in | Wt 108.0 lb

## 2024-01-21 DIAGNOSIS — M545 Low back pain, unspecified: Secondary | ICD-10-CM | POA: Diagnosis not present

## 2024-01-21 DIAGNOSIS — M8580 Other specified disorders of bone density and structure, unspecified site: Secondary | ICD-10-CM | POA: Diagnosis not present

## 2024-01-21 DIAGNOSIS — E7800 Pure hypercholesterolemia, unspecified: Secondary | ICD-10-CM | POA: Diagnosis not present

## 2024-01-21 DIAGNOSIS — M419 Scoliosis, unspecified: Secondary | ICD-10-CM | POA: Insufficient documentation

## 2024-01-21 NOTE — Assessment & Plan Note (Signed)
 Bilateral lower back pain - near SI joints Occurs when she gets up after sitting longer periods and improves as she moves more Walks, does pilates Advised seeing sports medicine

## 2024-01-21 NOTE — Assessment & Plan Note (Signed)
 Chronic Management per Gyn - Dr Carney Corners Dexa up to date Takes evista 60 mg daily Taking calcium and vitamin d daily Exercising regularly

## 2024-01-21 NOTE — Patient Instructions (Addendum)
     Medications changes include :   None    Delfin Gant, MD Sports Medicine at [facility]  Yancey Flemings, MD [facility] sports medicine - [facility] at [facility]    Return for follow up annually and as needed.

## 2024-01-21 NOTE — Assessment & Plan Note (Signed)
 Chronic Following with cardiology On crestor 5 mg daily Exercises regularly

## 2024-02-02 ENCOUNTER — Other Ambulatory Visit

## 2024-02-02 ENCOUNTER — Telehealth: Payer: Self-pay | Admitting: Internal Medicine

## 2024-02-02 DIAGNOSIS — E7800 Pure hypercholesterolemia, unspecified: Secondary | ICD-10-CM

## 2024-02-02 DIAGNOSIS — M8580 Other specified disorders of bone density and structure, unspecified site: Secondary | ICD-10-CM

## 2024-02-02 NOTE — Telephone Encounter (Signed)
 Blood work ordered-not sure if she needs a call back or not.

## 2024-02-02 NOTE — Addendum Note (Signed)
 Addended by: Colene Dauphin on: 02/02/2024 08:53 PM   Modules accepted: Orders

## 2024-02-02 NOTE — Telephone Encounter (Signed)
 Good afternoon Dr. Donnette Gal,  Patient is requesting fasting lab work for 03/07/2024 when she comes in to see the [facility]. Can you please put in the orders? Appt is scheduled for labs as well.  [facility] Care Guide [facility] AWV TEAM Direct Dial: [phone]

## 2024-03-07 ENCOUNTER — Ambulatory Visit (INDEPENDENT_AMBULATORY_CARE_PROVIDER_SITE_OTHER)

## 2024-03-07 ENCOUNTER — Other Ambulatory Visit (INDEPENDENT_AMBULATORY_CARE_PROVIDER_SITE_OTHER)

## 2024-03-07 ENCOUNTER — Encounter: Payer: Self-pay | Admitting: Internal Medicine

## 2024-03-07 VITALS — BP 122/68 | HR 69 | Ht 64.0 in | Wt 109.2 lb

## 2024-03-07 DIAGNOSIS — Z1159 Encounter for screening for other viral diseases: Secondary | ICD-10-CM

## 2024-03-07 DIAGNOSIS — Z0000 Encounter for general adult medical examination without abnormal findings: Secondary | ICD-10-CM | POA: Diagnosis not present

## 2024-03-07 DIAGNOSIS — E7800 Pure hypercholesterolemia, unspecified: Secondary | ICD-10-CM

## 2024-03-07 DIAGNOSIS — M8580 Other specified disorders of bone density and structure, unspecified site: Secondary | ICD-10-CM

## 2024-03-07 LAB — CBC WITH DIFFERENTIAL/PLATELET
Basophils Absolute: 0.1 10*3/uL (ref 0.0–0.1)
Basophils Relative: 0.8 % (ref 0.0–3.0)
Eosinophils Absolute: 0 10*3/uL (ref 0.0–0.7)
Eosinophils Relative: 0.3 % (ref 0.0–5.0)
HCT: 38.5 % (ref 36.0–46.0)
Hemoglobin: 12.6 g/dL (ref 12.0–15.0)
Lymphocytes Relative: 18.1 % (ref 12.0–46.0)
Lymphs Abs: 2 10*3/uL (ref 0.7–4.0)
MCHC: 32.7 g/dL (ref 30.0–36.0)
MCV: 85 fl (ref 78.0–100.0)
Monocytes Absolute: 0.9 10*3/uL (ref 0.1–1.0)
Monocytes Relative: 7.8 % (ref 3.0–12.0)
Neutro Abs: 8.1 10*3/uL — ABNORMAL HIGH (ref 1.4–7.7)
Neutrophils Relative %: 73 % (ref 43.0–77.0)
Platelets: 316 10*3/uL (ref 150.0–400.0)
RBC: 4.53 Mil/uL (ref 3.87–5.11)
RDW: 12.8 % (ref 11.5–15.5)
WBC: 11.1 10*3/uL — ABNORMAL HIGH (ref 4.0–10.5)

## 2024-03-07 LAB — LIPID PANEL
Cholesterol: 181 mg/dL (ref 0–200)
HDL: 69.3 mg/dL (ref >39.00)
LDL Cholesterol: 99 mg/dL (ref 0–99)
NonHDL: 112.13
Total CHOL/HDL Ratio: 3
Triglycerides: 65 mg/dL (ref 0.0–149.0)
VLDL: 13 mg/dL (ref 0.0–40.0)

## 2024-03-07 LAB — COMPREHENSIVE METABOLIC PANEL WITH GFR
ALT: 8 U/L (ref 0–35)
AST: 15 U/L (ref 0–37)
Albumin: 4.2 g/dL (ref 3.5–5.2)
Alkaline Phosphatase: 50 U/L (ref 39–117)
BUN: 16 mg/dL (ref 6–23)
CO2: 26 meq/L (ref 19–32)
Calcium: 10 mg/dL (ref 8.4–10.5)
Chloride: 104 meq/L (ref 96–112)
Creatinine, Ser: 0.8 mg/dL (ref 0.40–1.20)
GFR: 76.81 mL/min (ref >60.00)
Glucose, Bld: 89 mg/dL (ref 70–99)
Potassium: 4.2 meq/L (ref 3.5–5.1)
Sodium: 140 meq/L (ref 135–145)
Total Bilirubin: 0.3 mg/dL (ref 0.2–1.2)
Total Protein: 7.2 g/dL (ref 6.0–8.3)

## 2024-03-07 LAB — TSH: TSH: 1.21 u[IU]/mL (ref 0.35–5.50)

## 2024-03-07 NOTE — Progress Notes (Signed)
 Subjective:   Alexis Cooper is a 68 y.o. who presents for a Medicare Wellness preventive visit.  As a reminder, Annual Wellness Visits don't include a physical exam, and some assessments may be limited, especially if this visit is performed virtually. We may recommend an in-person follow-up visit with your provider if needed.  Visit Complete: In person  Persons Participating in Visit: Patient.  AWV Questionnaire: Yes: Patient Medicare AWV questionnaire was completed by the patient on 03/04/2024; I have confirmed that all information answered by patient is correct and no changes since this date.  Cardiac Risk Factors include: advanced age (>31men, >53 women);dyslipidemia     Objective:     Today's Vitals   03/07/24 1055  BP: 122/68  Pulse: 69  SpO2: 98%  Weight: 109 lb 3.2 oz (49.5 kg)  Height: 5\' 4"  (1.626 m)   Body mass index is 18.74 kg/m.     03/07/2024   10:55 AM  Advanced Directives  Does Patient Have a Medical Advance Directive? Yes  Type of Estate agent of [place];Living will  Copy of Healthcare Power of Attorney in Chart? No - copy requested    Current Medications (verified) Outpatient Encounter Medications as of 03/07/2024  Medication Sig   BIOTIN PO Take 1 tablet by mouth daily at 6 (six) AM.   Calcium Carb-Cholecalciferol (CALCIUM 1000 + D PO) Take 2 tablets by mouth daily at 6 (six) AM.   Cholecalciferol (VITAMIN D3 PO) Take 1 tablet by mouth daily at 6 (six) AM.   Coenzyme Q10 (COQ10 PO) Take 1 tablet by mouth daily at 6 (six) AM.   raloxifene (EVISTA) 60 MG tablet Take 60 mg by mouth daily.   rosuvastatin (CRESTOR) 10 MG tablet Take 5 mg by mouth daily.   No facility-administered encounter medications on file as of 03/07/2024.    Allergies (verified) Patient has no known allergies.   History: Past Medical History:  Diagnosis Date   Chronic neck pain    Hyperlipemia    on meds   Osteoporosis    bone scan confirmation per  pt   Past Surgical History:  Procedure Laterality Date   COLONOSCOPY  08/18/2017   SA-MAC-suprep(good)-tics/hems/touruous colon/SSP x 1   POLYPECTOMY  2018   SSP   TONSILLECTOMY     WISDOM TOOTH EXTRACTION     Family History  Problem Relation Age of Onset   Diabetes Mother    Breast cancer Mother 53   Glaucoma Mother    Heart disease Father    Congestive Heart Failure Father    Melanoma Sister 49   Colon cancer Neg Hx    Colon polyps Neg Hx    Esophageal cancer Neg Hx    Rectal cancer Neg Hx    Stomach cancer Neg Hx    Social History   Socioeconomic History   Marital status: Married    Spouse name: Not on file   Number of children: 2   Years of education: Not on file   Highest education level: Bachelor's degree (e.g., BA, AB, BS)  Occupational History   Occupation: Interior and spatial designer of operations  Tobacco Use   Smoking status: Former    Current packs/day: 0.00    Types: Cigarettes    Start date: 1982    Quit date: 1985    Years since quitting: 40.4   Smokeless tobacco: Never  Vaping Use   Vaping status: Never Used  Substance and Sexual Activity   Alcohol use: Yes    Comment:  rarely, less than weekly   Drug use: No   Sexual activity: Not on file  Other Topics Concern   Not on file  Social History Narrative   Married   Social Drivers of Health   Cooper Resource Strain: Low Risk  (03/07/2024)   Overall Cooper Resource Strain (CARDIA)    Difficulty of Paying Living Expenses: Not hard at all  Food Insecurity: No Food Insecurity (03/07/2024)   Hunger Vital Sign    Worried About Running Out of Food in the Last Year: Never true    Ran Out of Food in the Last Year: Never true  Transportation Needs: No Transportation Needs (03/07/2024)   PRAPARE - Administrator, Civil Service (Medical): No    Lack of Transportation (Non-Medical): No  Physical Activity: Sufficiently Active (03/07/2024)   Exercise Vital Sign    Days of Exercise per Week: 7 days     Minutes of Exercise per Session: 80 min  Stress: Stress Concern Present (03/07/2024)   Alexis Cooper of Occupational Health - Occupational Stress Questionnaire    Feeling of Stress : To some extent  Social Connections: Socially Integrated (03/07/2024)   Social Connection and Isolation Panel [NHANES]    Frequency of Communication with Friends and Family: More than three times a week    Frequency of Social Gatherings with Friends and Family: Twice a week    Attends Religious Services: More than 4 times per year    Active Member of Alexis Cooper or Organizations: Yes    Attends Engineer, structural: More than 4 times per year    Marital Status: Married    Tobacco Counseling Counseling given: No    Clinical Intake:  Pre-visit preparation completed: Yes  Pain : No/denies pain     BMI - recorded: 18.74 Nutritional Risks: None Diabetes: No  No results found for: "HGBA1C"   How often do you need to have someone help you when you read instructions, pamphlets, or other written materials from your doctor or pharmacy?: 1 - Never  Interpreter Needed?: No  Information entered by :: Alexis Cooper, CMA   Activities of Daily Living     03/07/2024   10:58 AM 03/04/2024    9:44 AM  In your present state of health, do you have any difficulty performing the following activities:  Hearing? 0 0  Vision? 0 0  Difficulty concentrating or making decisions? 0 0  Walking or climbing stairs? 0 0  Dressing or bathing? 0 0  Doing errands, shopping? 0 0  Preparing Food and eating ? N N  In the past six months, have you accidently leaked urine? N N  Do you have problems with loss of bowel control? N N  Managing your Medications? N N  Managing your Finances? N N  Housekeeping or managing your Housekeeping? N N    Patient Care Team: Alexis Dauphin, MD as PCP - General (Internal Medicine) Alexis Donning, MD as PCP - Cardiology (Cardiology) Alexis Cooper, OD as Referring  Physician (Optometry)  Indicate any recent Medical Services you may have received from other than [facility] providers in the past year (date may be approximate).     Assessment:    This is a routine wellness examination for Alexis Cooper.  Hearing/Vision screen Hearing Screening - Comments:: Denies hearing difficulties   Vision Screening - Comments:: Wears rx glasses - up to date with routine eye exams with Dr Meribeth Standard, in [place]   Goals Addressed  This Visit's Progress     Patient Stated (pt-stated)        Patient stated she wants to eat cleaner and reduce sweets.       Depression Screen     03/07/2024   10:59 AM  PHQ 2/9 Scores  PHQ - 2 Score 0  PHQ- 9 Score 3    Fall Risk     03/07/2024   10:58 AM 03/04/2024    9:44 AM  Fall Risk   Falls in the past year? 0 0  Number falls in past yr: 0 0  Injury with Fall? 0 0  Risk for fall due to : No Fall Risks   Follow up Falls prevention discussed;Falls evaluation completed     MEDICARE RISK AT HOME:  Medicare Risk at Home Any stairs in or around the home?: Yes If so, are there any without handrails?: Yes Home free of loose throw rugs in walkways, pet beds, electrical cords, etc?: No Adequate lighting in your home to reduce risk of falls?: Yes Life alert?: No Use of a cane, walker or w/c?: No Grab bars in the bathroom?: No Shower chair or bench in shower?: No Elevated toilet seat or a handicapped toilet?: No  TIMED UP AND GO:  Was the test performed?  No  Cognitive Function: 6CIT completed        03/07/2024   11:03 AM  6CIT Screen  What Year? 0 points  What month? 0 points  What time? 0 points  Count back from 20 0 points  Months in reverse 0 points  Repeat phrase 0 points  Total Score 0 points    Immunizations Immunization History  Administered Date(s) Administered   Moderna Sars-Covid-2 Vaccination 12/30/2019, 01/26/2020, 10/04/2020    Screening Tests Health Maintenance  Topic Date Due    Hepatitis C Screening  Never done   DTaP/Tdap/Td (1 - Tdap) Never done   Pneumonia Vaccine 8+ Years old (1 of 1 - PCV) Never done   MAMMOGRAM  Never done   Zoster Vaccines- Shingrix (1 of 2) Never done   DEXA SCAN  Never done   COVID-19 Vaccine (4 - 2024-25 season) 06/20/2023   INFLUENZA VACCINE  05/19/2024   Medicare Annual Wellness (AWV)  03/07/2025   Colonoscopy  08/14/2029   HPV VACCINES  Aged Out   Meningococcal B Vaccine  Aged Out    Health Maintenance  Health Maintenance Due  Topic Date Due   Hepatitis C Screening  Never done   DTaP/Tdap/Td (1 - Tdap) Never done   Pneumonia Vaccine 57+ Years old (1 of 1 - PCV) Never done   MAMMOGRAM  Never done   Zoster Vaccines- Shingrix (1 of 2) Never done   DEXA SCAN  Never done   COVID-19 Vaccine (4 - 2024-25 season) 06/20/2023   Health Maintenance Items Addressed:  Pt has been educated and advised on getting the Tdap, COVID, and Pneumonia vaccines at local pharmacy.  Pt stated has had the Shingles vaccines.  Advised to obtain a full immunization report from the pharmcy.   Pt stated had Mammogram and DEXA scan in [place] .  Advised to obtain both reports for PCP for review and update records.    Pt declined the Influenza vaccine for 2025.  Ordered a Hepatitis C Screening today.  Additional Screening:  Vision Screening: Recommended annual ophthalmology exams for early detection of glaucoma and other disorders of the eye. Pt stated had annual eye exam with Dr Melven Stable. [facility] in [place] .  Dental Screening: Recommended annual dental exams for proper oral hygiene  Community Resource Referral / Chronic Care Management: CRR required this visit?  No   CCM required this visit?  No   Plan:    I have personally reviewed and noted the following in the patient's chart:   Medical and social history Use of alcohol, tobacco or illicit drugs  Current medications and supplements including opioid prescriptions. Patient is not  currently taking opioid prescriptions. Functional ability and status Nutritional status Physical activity Advanced directives List of other physicians Hospitalizations, surgeries, and ER visits in previous 12 months Vitals Screenings to include cognitive, depression, and falls Referrals and appointments  In addition, I have reviewed and discussed with patient certain preventive protocols, quality metrics, and best practice recommendations. A written personalized care plan for preventive services as well as general preventive health recommendations were provided to patient.   Alexis Cooper, CMA   03/07/2024   After Visit Summary: (In Person-Declined) Patient declined AVS at this time.  Notes: Nothing significant to report at this time.

## 2024-03-07 NOTE — Patient Instructions (Addendum)
 Ms. Frick , Thank you for taking time out of your busy schedule to complete your Annual Wellness Visit with me. I enjoyed our conversation and look forward to speaking with you again next year. I, as well as your care team,  appreciate your ongoing commitment to your health goals. Please review the following plan we discussed and let me know if I can assist you in the future. Your Game plan/ To Do List    Follow up Visits: Next Medicare AWV with our clinical staff: 03/13/2025   Have you seen your provider in the last 6 months (3 months if uncontrolled diabetes)? Yes - 01/2024 Next Office Visit with your provider: 03/13/2025 - Physical  Clinician Recommendations:  Aim for 30 minutes of exercise or brisk walking, 6-8 glasses of water, and 5 servings of fruits and vegetables each day. Educated and advised on getting the Pneumonia, Tdap (Tetenus), and COVID vaccines in 2025 at local pharmacy.  Patient will obtain up-to-date immunization report from local pharmacy (showing had Shingles vaccines).      This is a list of the screening recommended for you and due dates:  Health Maintenance  Topic Date Due   Hepatitis C Screening  Never done   DTaP/Tdap/Td vaccine (1 - Tdap) Never done   Pneumonia Vaccine (1 of 1 - PCV) Never done   Mammogram  Never done   Zoster (Shingles) Vaccine (1 of 2) Never done   DEXA scan (bone density measurement)  Never done   COVID-19 Vaccine (4 - 2024-25 season) 06/20/2023   Flu Shot  05/19/2024   Medicare Annual Wellness Visit  03/07/2025   Colon Cancer Screening  08/14/2029   HPV Vaccine  Aged Out   Meningitis B Vaccine  Aged Out    Advanced directives: (Copy Requested) Please bring a copy of your health care power of attorney and living will to the office to be added to your chart at your convenience. You can mail to [facility] [address] [place], [place] [address] or email to [email or web address] Advance Care Planning is important  because it:  [x]  Makes sure you receive the medical care that is consistent with your values, goals, and preferences  [x]  It provides guidance to your family and loved ones and reduces their decisional burden about whether or not they are making the right decisions based on your wishes.  Follow the link provided in your after visit summary or read over the paperwork we have mailed to you to help you started getting your Advance Directives in place. If you need assistance in completing these, please reach out to us  so that we can help you!

## 2024-03-08 LAB — HEPATITIS C ANTIBODY: Hepatitis C Ab: NONREACTIVE

## 2024-03-09 ENCOUNTER — Ambulatory Visit: Payer: Self-pay | Admitting: Internal Medicine

## 2024-09-11 NOTE — Progress Notes (Unsigned)
 Chief Complaint: Primary GI MD: Dr. Leigh  HPI:   Last seen by Vina Dasen, NP 02/2023 for epigastric pain and nausea ongoing for 1 month with pain exacerbated by eating and history of diclofenac use patient was put on omeprazole  40 mg once daily.  Also was struggling with constipation and abdominal pain patient was put on MiraLAX bowel purge and treated empirically for diverticulitis with Cipro /Flagyl   Discussed the use of AI scribe software for clinical note transcription with the patient, who gave verbal consent to proceed.  History of Present Illness      PREVIOUS GI WORKUP   Oct 2023 surveillance colonoscopy - Tortuous colon. - One 4 mm polyp in the sigmoid colon, removed with a cold snare. Resected and retrieved. - Diverticulosis in the sigmoid colon.  Past Medical History:  Diagnosis Date   Chronic neck pain    Hyperlipemia    on meds   Osteoporosis    bone scan confirmation per pt    Past Surgical History:  Procedure Laterality Date   COLONOSCOPY  08/18/2017   SA-MAC-suprep(good)-tics/hems/touruous colon/SSP x 1   POLYPECTOMY  2018   SSP   TONSILLECTOMY     WISDOM TOOTH EXTRACTION      Current Outpatient Medications  Medication Sig Dispense Refill   BIOTIN PO Take 1 tablet by mouth daily at 6 (six) AM.     Calcium Carb-Cholecalciferol (CALCIUM 1000 + D PO) Take 2 tablets by mouth daily at 6 (six) AM.     Cholecalciferol (VITAMIN D3 PO) Take 1 tablet by mouth daily at 6 (six) AM.     Coenzyme Q10 (COQ10 PO) Take 1 tablet by mouth daily at 6 (six) AM.     raloxifene (EVISTA) 60 MG tablet Take 60 mg by mouth daily.     rosuvastatin (CRESTOR) 10 MG tablet Take 5 mg by mouth daily.     No current facility-administered medications for this visit.    Allergies as of 09/12/2024   (No Known Allergies)    Family History  Problem Relation Age of Onset   Diabetes Mother    Breast cancer Mother 37   Glaucoma Mother    Heart disease Father     Congestive Heart Failure Father    Melanoma Sister 19   Colon cancer Neg Hx    Colon polyps Neg Hx    Esophageal cancer Neg Hx    Rectal cancer Neg Hx    Stomach cancer Neg Hx     Social History   Socioeconomic History   Marital status: Married    Spouse name: Not on file   Number of children: 2   Years of education: Not on file   Highest education level: Bachelor's degree (e.g., BA, AB, BS)  Occupational History   Occupation: Interior And Spatial Designer of operations  Tobacco Use   Smoking status: Former    Current packs/day: 0.00    Types: Cigarettes    Start date: 1982    Quit date: 1985    Years since quitting: 40.9   Smokeless tobacco: Never  Vaping Use   Vaping status: Never Used  Substance and Sexual Activity   Alcohol use: Yes    Comment: rarely, less than weekly   Drug use: No   Sexual activity: Not on file  Other Topics Concern   Not on file  Social History Narrative   Married   Social Drivers of Health   Financial Resource Strain: Low Risk  (03/07/2024)   Overall Physicist, Medical Strain (  CARDIA)    Difficulty of Paying Living Expenses: Not hard at all  Food Insecurity: No Food Insecurity (03/07/2024)   Hunger Vital Sign    Worried About Running Out of Food in the Last Year: Never true    Ran Out of Food in the Last Year: Never true  Transportation Needs: No Transportation Needs (03/07/2024)   PRAPARE - Administrator, Civil Service (Medical): No    Lack of Transportation (Non-Medical): No  Physical Activity: Sufficiently Active (03/07/2024)   Exercise Vital Sign    Days of Exercise per Week: 7 days    Minutes of Exercise per Session: 80 min  Stress: Stress Concern Present (03/07/2024)   [employer] of Occupational Health - Occupational Stress Questionnaire    Feeling of Stress : To some extent  Social Connections: Socially Integrated (03/07/2024)   Social Connection and Isolation Panel    Frequency of Communication with Friends and Family: More than  three times a week    Frequency of Social Gatherings with Friends and Family: Twice a week    Attends Religious Services: More than 4 times per year    Active Member of [employer] or Organizations: Yes    Attends Engineer, Structural: More than 4 times per year    Marital Status: Married  Catering Manager Violence: Not At Risk (03/07/2024)   Humiliation, Afraid, Rape, and Kick questionnaire    Fear of Current or Ex-Partner: No    Emotionally Abused: No    Physically Abused: No    Sexually Abused: No    Review of Systems:    Constitutional: No weight loss, fever, chills, weakness or fatigue HEENT: Eyes: No change in vision               Ears, Nose, Throat:  No change in hearing or congestion Skin: No rash or itching Cardiovascular: No chest pain, chest pressure or palpitations   Respiratory: No SOB or cough Gastrointestinal: See HPI and otherwise negative Genitourinary: No dysuria or change in urinary frequency Neurological: No headache, dizziness or syncope Musculoskeletal: No new muscle or joint pain Hematologic: No bleeding or bruising Psychiatric: No history of depression or anxiety    Physical Exam:  Vital signs: There were no vitals taken for this visit.  Constitutional: NAD, alert and cooperative Head:  Normocephalic and atraumatic. Eyes:   PEERL, EOMI. No icterus. Conjunctiva pink. Respiratory: Respirations even and unlabored. Lungs clear to auscultation bilaterally.   No wheezes, crackles, or rhonchi.  Cardiovascular:  Regular rate and rhythm. No peripheral edema, cyanosis or pallor.  Gastrointestinal:  Soft, nondistended, nontender. No rebound or guarding. Normal bowel sounds. No appreciable masses or hepatomegaly. Rectal:  Declines Msk:  Symmetrical without gross deformities. Without edema, no deformity or joint abnormality.  Neurologic:  Alert and  oriented x4;  grossly normal neurologically.  Skin:   Dry and intact without significant lesions or  rashes. Psychiatric: Oriented to person, place and time. Demonstrates good judgement and reason without abnormal affect or behaviors.  Physical Exam    RELEVANT LABS AND IMAGING: CBC    Component Value Date/Time   WBC 11.1 (H) 03/07/2024 1009   RBC 4.53 03/07/2024 1009   HGB 12.6 03/07/2024 1009   HCT 38.5 03/07/2024 1009   PLT 316.0 03/07/2024 1009   MCV 85.0 03/07/2024 1009   MCHC 32.7 03/07/2024 1009   RDW 12.8 03/07/2024 1009   LYMPHSABS 2.0 03/07/2024 1009   MONOABS 0.9 03/07/2024 1009   EOSABS 0.0 03/07/2024  1009   BASOSABS 0.1 03/07/2024 1009    CMP     Component Value Date/Time   NA 140 03/07/2024 1009   K 4.2 03/07/2024 1009   CL 104 03/07/2024 1009   CO2 26 03/07/2024 1009   GLUCOSE 89 03/07/2024 1009   BUN 16 03/07/2024 1009   CREATININE 0.80 03/07/2024 1009   CALCIUM 10.0 03/07/2024 1009   PROT 7.2 03/07/2024 1009   ALBUMIN 4.2 03/07/2024 1009   AST 15 03/07/2024 1009   ALT 8 03/07/2024 1009   ALKPHOS 50 03/07/2024 1009   BILITOT 0.3 03/07/2024 1009     Assessment/Plan:       History of colon polyps Colonoscopy October 2023 with 4 mm tubular adenoma in sigmoid colon, diverticulosis, otherwise normal.  Repeat 7 years - Repeat October 2030  History of diverticulitis Diverticulitis confirmed by CT 01/2020  Nestor Blower, PA-C [facility] 09/11/2024, 11:48 AM  Cc: Geofm Glade PARAS, MD

## 2024-09-12 ENCOUNTER — Encounter: Payer: Self-pay | Admitting: Gastroenterology

## 2024-09-12 ENCOUNTER — Ambulatory Visit (INDEPENDENT_AMBULATORY_CARE_PROVIDER_SITE_OTHER): Admitting: Gastroenterology

## 2024-09-12 ENCOUNTER — Other Ambulatory Visit

## 2024-09-12 VITALS — BP 112/70 | HR 80 | Ht 64.0 in | Wt 109.1 lb

## 2024-09-12 DIAGNOSIS — K219 Gastro-esophageal reflux disease without esophagitis: Secondary | ICD-10-CM | POA: Diagnosis not present

## 2024-09-12 DIAGNOSIS — R1013 Epigastric pain: Secondary | ICD-10-CM | POA: Diagnosis not present

## 2024-09-12 DIAGNOSIS — Z860100 Personal history of colon polyps, unspecified: Secondary | ICD-10-CM | POA: Diagnosis not present

## 2024-09-12 LAB — CBC WITH DIFFERENTIAL/PLATELET
Basophils Absolute: 0.1 K/uL (ref 0.0–0.1)
Basophils Relative: 1.3 % (ref 0.0–3.0)
Eosinophils Absolute: 0 K/uL (ref 0.0–0.7)
Eosinophils Relative: 0.5 % (ref 0.0–5.0)
HCT: 38.6 % (ref 36.0–46.0)
Hemoglobin: 12.9 g/dL (ref 12.0–15.0)
Lymphocytes Relative: 37.8 % (ref 12.0–46.0)
Lymphs Abs: 1.9 K/uL (ref 0.7–4.0)
MCHC: 33.5 g/dL (ref 30.0–36.0)
MCV: 85 fl (ref 78.0–100.0)
Monocytes Absolute: 0.4 K/uL (ref 0.1–1.0)
Monocytes Relative: 8.1 % (ref 3.0–12.0)
Neutro Abs: 2.6 K/uL (ref 1.4–7.7)
Neutrophils Relative %: 52.3 % (ref 43.0–77.0)
Platelets: 223 K/uL (ref 150.0–400.0)
RBC: 4.54 Mil/uL (ref 3.87–5.11)
RDW: 12.9 % (ref 11.5–15.5)
WBC: 5 K/uL (ref 4.0–10.5)

## 2024-09-12 LAB — COMPREHENSIVE METABOLIC PANEL WITH GFR
ALT: 12 U/L (ref 0–35)
AST: 20 U/L (ref 0–37)
Albumin: 4.5 g/dL (ref 3.5–5.2)
Alkaline Phosphatase: 48 U/L (ref 39–117)
BUN: 15 mg/dL (ref 6–23)
CO2: 30 meq/L (ref 19–32)
Calcium: 9.9 mg/dL (ref 8.4–10.5)
Chloride: 102 meq/L (ref 96–112)
Creatinine, Ser: 0.85 mg/dL (ref 0.40–1.20)
GFR: 71.16 mL/min (ref >60.00)
Glucose, Bld: 87 mg/dL (ref 70–99)
Potassium: 3.9 meq/L (ref 3.5–5.1)
Sodium: 138 meq/L (ref 135–145)
Total Bilirubin: 0.5 mg/dL (ref 0.2–1.2)
Total Protein: 6.8 g/dL (ref 6.0–8.3)

## 2024-09-12 LAB — LIPASE: Lipase: 6 U/L — ABNORMAL LOW (ref 11.0–59.0)

## 2024-09-12 MED ORDER — OMEPRAZOLE 40 MG PO CPDR
40.0000 mg | DELAYED_RELEASE_CAPSULE | Freq: Every day | ORAL | 3 refills | Status: AC
Start: 2024-09-12 — End: ?

## 2024-09-12 NOTE — Patient Instructions (Signed)
 _______________________________________________________  If your blood pressure at your visit was 140/90 or greater, please contact your primary care physician to follow up on this.  _______________________________________________________  If you are age 66 or older, your body mass index should be between 23-30. Your Body mass index is 18.73 kg/m. If this is out of the aforementioned range listed, please consider follow up with your Primary Care Provider.  If you are age 74 or younger, your body mass index should be between 19-25. Your Body mass index is 18.73 kg/m. If this is out of the aformentioned range listed, please consider follow up with your Primary Care Provider.   ________________________________________________________  The [facility] providers would like to encourage you to use MYCHART to communicate with providers for non-urgent requests or questions.  Due to long hold times on the telephone, sending your provider a message by [facility] may be a faster and more efficient way to get a response.  Please allow 48 business hours for a response.  Please remember that this is for non-urgent requests.  _______________________________________________________  [facility] is using a team-based approach to care.  Your team is made up of your doctor and two to three APPS. Our APPS (Nurse Practitioners and Physician Assistants) work with your physician to ensure care continuity for you. They are fully qualified to address your health concerns and develop a treatment plan. They communicate directly with your gastroenterologist to care for you. Seeing the Advanced Practice Practitioners on your physician's team can help you by facilitating care more promptly, often allowing for earlier appointments, access to diagnostic testing, procedures, and other specialty referrals.   Your provider has requested that you go to the basement level for lab work before leaving today. Press B on the  elevator. The lab is located at the first door on the left as you exit the elevator.  We have sent the following medications to your pharmacy for you to pick up at your convenience:  START: omeprazole  40mg  one capsule daily  Due to recent changes in healthcare laws, you may see the results of your imaging and laboratory studies on MyChart before your provider has had a chance to review them.  We understand that in some cases there may be results that are confusing or concerning to you. Not all laboratory results come back in the same time frame and the provider may be waiting for multiple results in order to interpret others.  Please give us  48 hours in order for your provider to thoroughly review all the results before contacting the office for clarification of your results.

## 2024-09-13 ENCOUNTER — Other Ambulatory Visit: Payer: Self-pay | Admitting: Gastroenterology

## 2024-09-13 NOTE — Telephone Encounter (Signed)
 duplicate

## 2024-09-14 NOTE — Progress Notes (Signed)
 Agree with assessment plan as outlined.  Bayley I think also reasonable to do a right upper quadrant ultrasound to screen for gallstones given her symptoms.  CT is not a good test for screening for that and it was also a few years ago.  If she is agreeable I would do right upper quadrant ultrasound to rule out gallstones.  Thanks

## 2024-09-20 ENCOUNTER — Telehealth: Payer: Self-pay | Admitting: *Deleted

## 2024-09-20 DIAGNOSIS — R0789 Other chest pain: Secondary | ICD-10-CM

## 2024-09-20 DIAGNOSIS — R1013 Epigastric pain: Secondary | ICD-10-CM

## 2024-09-20 NOTE — Telephone Encounter (Signed)
 Spoke to patient to advise of Dr Hassan recommendation to move forward with RUQ ultrasound to rule out gallstones as the cause of her symptoms. Patient is in agreement with this. She has been scheduled for ultrasound at [facility] on Wednesday 09/27/24 at 10:30 am, 1015 am arrival, NPO 4 hours prior. Patient verbalizes understanding of time/date/location/prep for ultrasound.

## 2024-09-20 NOTE — Telephone Encounter (Signed)
 Left message for patient to call back

## 2024-09-20 NOTE — Telephone Encounter (Signed)
 Author: Leigh Elspeth SQUIBB, MD Service: Gastroenterology Author Type: Physician  Filed: 09/14/2024 11:25 AM Encounter Date: 09/12/2024 Status: Signed  Editor: Armbruster, Elspeth SQUIBB, MD (Physician)   Agree with assessment plan as outlined.   Bayley I think also reasonable to do a right upper quadrant ultrasound to screen for gallstones given her symptoms.  CT is not a good test for screening for that and it was also a few years ago.  If she is agreeable I would do right upper quadrant ultrasound to rule out gallstones.  Thanks

## 2024-09-27 ENCOUNTER — Ambulatory Visit (HOSPITAL_COMMUNITY): Admission: RE | Admit: 2024-09-27 | Discharge: 2024-09-27 | Attending: Gastroenterology

## 2024-09-27 DIAGNOSIS — R1013 Epigastric pain: Secondary | ICD-10-CM | POA: Insufficient documentation

## 2024-09-27 DIAGNOSIS — R0789 Other chest pain: Secondary | ICD-10-CM | POA: Insufficient documentation

## 2024-09-30 ENCOUNTER — Ambulatory Visit: Payer: Self-pay | Admitting: Gastroenterology

## 2024-10-10 ENCOUNTER — Ambulatory Visit (INDEPENDENT_AMBULATORY_CARE_PROVIDER_SITE_OTHER): Admitting: Gastroenterology

## 2024-10-10 ENCOUNTER — Encounter: Payer: Self-pay | Admitting: Gastroenterology

## 2024-10-10 VITALS — BP 118/70 | HR 81 | Ht 64.0 in | Wt 109.0 lb

## 2024-10-10 DIAGNOSIS — K219 Gastro-esophageal reflux disease without esophagitis: Secondary | ICD-10-CM | POA: Diagnosis not present

## 2024-10-10 DIAGNOSIS — Z8719 Personal history of other diseases of the digestive system: Secondary | ICD-10-CM

## 2024-10-10 DIAGNOSIS — Z860101 Personal history of adenomatous and serrated colon polyps: Secondary | ICD-10-CM | POA: Diagnosis not present

## 2024-10-10 DIAGNOSIS — R0789 Other chest pain: Secondary | ICD-10-CM | POA: Diagnosis not present

## 2024-10-10 DIAGNOSIS — R1013 Epigastric pain: Secondary | ICD-10-CM

## 2024-10-10 DIAGNOSIS — Z860100 Personal history of colon polyps, unspecified: Secondary | ICD-10-CM

## 2024-10-10 NOTE — Progress Notes (Signed)
 "  Chief Complaint: follow up Primary GI MD: Dr. Leigh  HPI: note transcription with the patient, who gave verbal consent to proceed.  History of Present Illness   Alexis Cooper is a 68 year old female with gastroesophageal reflux disease who presents for evaluation of persistent upper abdominal and chest discomfort.  Intermittent upper abdominal pain and chest discomfort described as pressure rather than pain. Symptoms are not consistently related to meals and do not occur after eating late at night.  Overall sense of well-being is preserved, though uncertainty about the cause persists.  A prior episode of severe epigastric pain associated with eating and drinking lasted five to six days and resolved prior to the last visit. No recurrence of this lower abdominal pain since resolution.  Omeprazole  40 mg daily has been used, with inconsistent symptom relief. Ongoing anxiety about the possibility of serious conditions, including pancreatic cancer, is present, though no jaundice or significant weight loss has occurred.  Previous workup includes a normal CT scan in 2021 and a recent normal abdominal ultrasound. A recent laboratory result showed low lipase, which was not considered concerning. No abnormal findings have been identified on imaging or laboratory studies to date.  Diclofenac was discontinued and NSAIDs have not been resumed. Concern remains about possible lasting gastrointestinal effects from prior NSAID use.  Persistent worry about symptoms and their etiology has caused significant anxiety, and she seeks clarity and reassurance regarding her gastrointestinal health. Wonders if she has pancreatic cancer. Denies family history, weight loss, jaundice.   PREVIOUS GI WORKUP   Oct 2023 surveillance colonoscopy - Tortuous colon. - One 4 mm polyp in the sigmoid colon, removed with a cold snare. Resected and retrieved. - Diverticulosis in the sigmoid colon.  Past Medical History:   Diagnosis Date   Chronic neck pain    Hyperlipemia    on meds   Osteoporosis    bone scan confirmation per pt    Past Surgical History:  Procedure Laterality Date   COLONOSCOPY  08/18/2017   SA-MAC-suprep(good)-tics/hems/touruous colon/SSP x 1   POLYPECTOMY  2018   SSP   TONSILLECTOMY     WISDOM TOOTH EXTRACTION      Current Outpatient Medications  Medication Sig Dispense Refill   BIOTIN PO Take 1 tablet by mouth daily at 6 (six) AM.     Calcium Carb-Cholecalciferol (CALCIUM 1000 + D PO) Take 2 tablets by mouth daily at 6 (six) AM.     Cholecalciferol (VITAMIN D3 PO) Take 1 tablet by mouth daily at 6 (six) AM.     Coenzyme Q10 (COQ10 PO) Take 1 tablet by mouth daily at 6 (six) AM.     omeprazole  (PRILOSEC) 40 MG capsule Take 1 capsule (40 mg total) by mouth daily. 30 capsule 3   raloxifene (EVISTA) 60 MG tablet Take 60 mg by mouth daily.     rosuvastatin (CRESTOR) 10 MG tablet Take 5 mg by mouth daily.     No current facility-administered medications for this visit.    Allergies as of 10/10/2024   (No Known Allergies)    Family History  Problem Relation Age of Onset   Diabetes Mother    Breast cancer Mother 91   Glaucoma Mother    Heart disease Father    Congestive Heart Failure Father    Melanoma Sister 29   Colon cancer Neg Hx    Colon polyps Neg Hx    Esophageal cancer Neg Hx    Rectal cancer Neg Hx  Stomach cancer Neg Hx     Social History   Socioeconomic History   Marital status: Married    Spouse name: Not on file   Number of children: 2   Years of education: Not on file   Highest education level: Bachelor's degree (e.g., BA, AB, BS)  Occupational History   Occupation: Interior And Spatial Designer of operations  Tobacco Use   Smoking status: Former    Current packs/day: 0.00    Types: Cigarettes    Start date: 1982    Quit date: 1985    Years since quitting: 41.0   Smokeless tobacco: Never  Vaping Use   Vaping status: Never Used  Substance and Sexual  Activity   Alcohol use: Yes    Comment: rarely, less than weekly   Drug use: No   Sexual activity: Not on file  Other Topics Concern   Not on file  Social History Narrative   Married   Social Drivers of Health   Tobacco Use: Medium Risk (09/12/2024)   Patient History    Smoking Tobacco Use: Former    Smokeless Tobacco Use: Never    Passive Exposure: Not on Actuary Strain: Low Risk (03/07/2024)   Overall Financial Resource Strain (CARDIA)    Difficulty of Paying Living Expenses: Not hard at all  Food Insecurity: No Food Insecurity (03/07/2024)   Hunger Vital Sign    Worried About Running Out of Food in the Last Year: Never true    Ran Out of Food in the Last Year: Never true  Transportation Needs: No Transportation Needs (03/07/2024)   PRAPARE - Administrator, Civil Service (Medical): No    Lack of Transportation (Non-Medical): No  Physical Activity: Sufficiently Active (03/07/2024)   Exercise Vital Sign    Days of Exercise per Week: 7 days    Minutes of Exercise per Session: 80 min  Stress: Stress Concern Present (03/07/2024)   [employer] of Occupational Health - Occupational Stress Questionnaire    Feeling of Stress : To some extent  Social Connections: Socially Integrated (03/07/2024)   Social Connection and Isolation Panel    Frequency of Communication with Friends and Family: More than three times a week    Frequency of Social Gatherings with Friends and Family: Twice a week    Attends Religious Services: More than 4 times per year    Active Member of [employer] or Organizations: Yes    Attends Banker Meetings: More than 4 times per year    Marital Status: Married  Catering Manager Violence: Not At Risk (03/07/2024)   Humiliation, Afraid, Rape, and Kick questionnaire    Fear of Current or Ex-Partner: No    Emotionally Abused: No    Physically Abused: No    Sexually Abused: No  Depression (PHQ2-9): Low Risk (03/07/2024)    Depression (PHQ2-9)    PHQ-2 Score: 3  Alcohol Screen: Low Risk (03/07/2024)   Alcohol Screen    Last Alcohol Screening Score (AUDIT): 1  Housing: Unknown (03/07/2024)   Housing Stability Vital Sign    Unable to Pay for Housing in the Last Year: No    Number of Times Moved in the Last Year: Not on file    Homeless in the Last Year: No  Utilities: Not At Risk (03/07/2024)   AHC Utilities    Threatened with loss of utilities: No  Health Literacy: Adequate Health Literacy (03/07/2024)   B1300 Health Literacy    Frequency of need for help  with medical instructions: Never    Review of Systems:    Constitutional: No weight loss, fever, chills, weakness or fatigue HEENT: Eyes: No change in vision               Ears, Nose, Throat:  No change in hearing or congestion Skin: No rash or itching Cardiovascular: No chest pain, chest pressure or palpitations   Respiratory: No SOB or cough Gastrointestinal: See HPI and otherwise negative Genitourinary: No dysuria or change in urinary frequency Neurological: No headache, dizziness or syncope Musculoskeletal: No new muscle or joint pain Hematologic: No bleeding or bruising Psychiatric: No history of depression or anxiety    Physical Exam:  Vital signs: BP 118/70   Pulse 81   Ht 5' 4 (1.626 m)   Wt 109 lb (49.4 kg)   BMI 18.71 kg/m   Constitutional: NAD, alert and cooperative Head:  Normocephalic and atraumatic. Eyes:   PEERL, EOMI. No icterus. Conjunctiva pink. Respiratory: Respirations even and unlabored. Lungs clear to auscultation bilaterally.   No wheezes, crackles, or rhonchi.  Cardiovascular:  Regular rate and rhythm. No peripheral edema, cyanosis or pallor.  Gastrointestinal:  Soft, nondistended, nontender. No rebound or guarding. Normal bowel sounds. No appreciable masses or hepatomegaly. Rectal:  Declines Msk:  Symmetrical without gross deformities. Without edema, no deformity or joint abnormality.  Neurologic:  Alert and   oriented x4;  grossly normal neurologically.  Skin:   Dry and intact without significant lesions or rashes. Psychiatric: Oriented to person, place and time. Demonstrates good judgement and reason without abnormal affect or behaviors.  Physical Exam    RELEVANT LABS AND IMAGING: CBC    Component Value Date/Time   WBC 5.0 09/12/2024 1451   RBC 4.54 09/12/2024 1451   HGB 12.9 09/12/2024 1451   HCT 38.6 09/12/2024 1451   PLT 223.0 09/12/2024 1451   MCV 85.0 09/12/2024 1451   MCHC 33.5 09/12/2024 1451   RDW 12.9 09/12/2024 1451   LYMPHSABS 1.9 09/12/2024 1451   MONOABS 0.4 09/12/2024 1451   EOSABS 0.0 09/12/2024 1451   BASOSABS 0.1 09/12/2024 1451    CMP     Component Value Date/Time   NA 138 09/12/2024 1451   K 3.9 09/12/2024 1451   CL 102 09/12/2024 1451   CO2 30 09/12/2024 1451   GLUCOSE 87 09/12/2024 1451   BUN 15 09/12/2024 1451   CREATININE 0.85 09/12/2024 1451   CALCIUM 9.9 09/12/2024 1451   PROT 6.8 09/12/2024 1451   ALBUMIN 4.5 09/12/2024 1451   AST 20 09/12/2024 1451   ALT 12 09/12/2024 1451   ALKPHOS 48 09/12/2024 1451   BILITOT 0.5 09/12/2024 1451     Assessment/Plan:   Gastroesophageal reflux disease presenting as epigastric pain and chest discomfort Intermittent epigastric pain and chest discomfort suggestive of GERD. Given omeprazole  40mg  once daily at last visit with improvement. Normal labs and RUQ US . Previous NSAID use. Patient is very concerned and would like further diagnostics. Suspect GERD since she has improvement in symptoms on PPI. Discussed anxiety may also be contributing to worsening GERD. -- continue PPI -- schedule EGD for further evaluation -- I thoroughly discussed the procedure with the patient (at bedside) to include nature of the procedure, alternatives, benefits, and risks (including but not limited to bleeding, infection, perforation, anesthesia/cardiac pulmonary complications).  Patient verbalized understanding and gave verbal  consent to proceed with procedure.   History of colon polyps Colonoscopy October 2023 with 4 mm tubular adenoma in sigmoid colon, diverticulosis, otherwise  normal.  Repeat 7 years - Repeat October 2030   History of diverticulitis Diverticulitis confirmed by CT 01/2020   Nestor Blower, PA-C [facility] 10/10/2024, 2:29 PM  Cc: Geofm Glade PARAS, MD "

## 2024-10-10 NOTE — Progress Notes (Signed)
 Agree with assessment and plan as outlined.

## 2024-10-10 NOTE — Patient Instructions (Signed)
 You have been scheduled for a colonoscopy. Please follow written instructions given to you at your visit today.   If you use inhalers (even only as needed), please bring them with you on the day of your procedure.  DO NOT TAKE 7 DAYS PRIOR TO TEST- Trulicity (dulaglutide) Ozempic, Wegovy (semaglutide) Mounjaro, Zepbound (tirzepatide) Bydureon Bcise (exanatide extended release)  DO NOT TAKE 1 DAY PRIOR TO YOUR TEST Rybelsus (semaglutide) Adlyxin (lixisenatide) Victoza (liraglutide) Byetta (exanatide) ___________________________________________________________________________  Continue Omeprazole  40 mg.    Thank you for trusting me with your gastrointestinal care!   Nestor Blower, PA  _______________________________________________________  If your blood pressure at your visit was 140/90 or greater, please contact your primary care physician to follow up on this.  _______________________________________________________  If you are age 49 or older, your body mass index should be between 23-30. Your Body mass index is 18.71 kg/m. If this is out of the aforementioned range listed, please consider follow up with your Primary Care Provider.  If you are age 16 or younger, your body mass index should be between 19-25. Your Body mass index is 18.71 kg/m. If this is out of the aformentioned range listed, please consider follow up with your Primary Care Provider.   ________________________________________________________  The [facility] providers would like to encourage you to use MYCHART to communicate with providers for non-urgent requests or questions.  Due to long hold times on the telephone, sending your provider a message by [facility] may be a faster and more efficient way to get a response.  Please allow 48 business hours for a response.  Please remember that this is for non-urgent requests.  _______________________________________________________  [facility] is  using a team-based approach to care.  Your team is made up of your doctor and two to three APPS. Our APPS (Nurse Practitioners and Physician Assistants) work with your physician to ensure care continuity for you. They are fully qualified to address your health concerns and develop a treatment plan. They communicate directly with your gastroenterologist to care for you. Seeing the Advanced Practice Practitioners on your physician's team can help you by facilitating care more promptly, often allowing for earlier appointments, access to diagnostic testing, procedures, and other specialty referrals.

## 2024-11-03 ENCOUNTER — Telehealth: Payer: Self-pay | Admitting: Gastroenterology

## 2024-11-03 NOTE — Telephone Encounter (Signed)
 Inbound call from patient stating she would like to know how much her insurance would cover for upcoming EGD on 11/15/24 Requesting a call back  Please advise  Thank you

## 2024-11-15 ENCOUNTER — Ambulatory Visit: Admitting: Gastroenterology

## 2024-11-15 ENCOUNTER — Encounter: Payer: Self-pay | Admitting: Gastroenterology

## 2024-11-15 VITALS — BP 103/74 | HR 78 | Temp 98.0°F | Resp 12 | Ht 64.0 in | Wt 109.0 lb

## 2024-11-15 DIAGNOSIS — K2950 Unspecified chronic gastritis without bleeding: Secondary | ICD-10-CM

## 2024-11-15 DIAGNOSIS — K219 Gastro-esophageal reflux disease without esophagitis: Secondary | ICD-10-CM

## 2024-11-15 DIAGNOSIS — R1013 Epigastric pain: Secondary | ICD-10-CM

## 2024-11-15 DIAGNOSIS — K449 Diaphragmatic hernia without obstruction or gangrene: Secondary | ICD-10-CM | POA: Diagnosis not present

## 2024-11-15 MED ORDER — SODIUM CHLORIDE 0.9 % IV SOLN: 500.0000 mL | Freq: Once | INTRAVENOUS | Status: DC

## 2024-11-15 NOTE — Progress Notes (Signed)
 [facility] History and Physical   Primary Care Physician:  Geofm Glade PARAS, MD   Reason for Procedure:   Epigastric pain, suspected GERD  Plan:    EGD     HPI: Alexis Cooper is a 69 y.o. female  here for EGD to evaluate epigastric pain and symptoms suggestive of GERD. On omeprazole  40mg  / day which has significantly helped but not resolved it. No dysphagia. NO exertional component. Eating okay. Weight stable. No prior EGD.   Otherwise feels well without any cardiopulmonary symptoms.   I have discussed risks / benefits of anesthesia and endoscopic procedure with Alexis Cooper and they wish to proceed with the exams as outlined today.   The patient was provided an opportunity to ask questions and all were answered. The patient agreed with the plan.    Past Medical History:  Diagnosis Date   Arthritis    Chronic neck pain    Hyperlipemia    on meds   Osteoporosis    bone scan confirmation per pt- osteopenia    Past Surgical History:  Procedure Laterality Date   COLONOSCOPY  08/18/2017   SA-MAC-suprep(good)-tics/hems/touruous colon/SSP x 1   POLYPECTOMY  2018   SSP   TONSILLECTOMY     WISDOM TOOTH EXTRACTION      Prior to Admission medications  Medication Sig Start Date End Date Taking? Authorizing Provider  BIOTIN PO Take 1 tablet by mouth daily at 6 (six) AM.   Yes [provider]  Calcium Carb-Cholecalciferol (CALCIUM 1000 + D PO) Take 2 tablets by mouth daily at 6 (six) AM.   Yes [provider]  Cholecalciferol (VITAMIN D3 PO) Take 1 tablet by mouth daily at 6 (six) AM.   Yes [provider]  Coenzyme Q10 (COQ10 PO) Take 1 tablet by mouth daily at 6 (six) AM.   Yes [provider]  omeprazole  (PRILOSEC) 40 MG capsule Take 1 capsule (40 mg total) by mouth daily. 09/12/24  Yes McMichael, Alexis M, PA-C  raloxifene (EVISTA) 60 MG tablet Take 60 mg by mouth daily.   Yes [provider]  rosuvastatin (CRESTOR) 10  MG tablet Take 5 mg by mouth daily.   Yes [provider]    Current Outpatient Medications  Medication Sig Dispense Refill   BIOTIN PO Take 1 tablet by mouth daily at 6 (six) AM.     Calcium Carb-Cholecalciferol (CALCIUM 1000 + D PO) Take 2 tablets by mouth daily at 6 (six) AM.     Cholecalciferol (VITAMIN D3 PO) Take 1 tablet by mouth daily at 6 (six) AM.     Coenzyme Q10 (COQ10 PO) Take 1 tablet by mouth daily at 6 (six) AM.     omeprazole  (PRILOSEC) 40 MG capsule Take 1 capsule (40 mg total) by mouth daily. 30 capsule 3   raloxifene (EVISTA) 60 MG tablet Take 60 mg by mouth daily.     rosuvastatin (CRESTOR) 10 MG tablet Take 5 mg by mouth daily.     Current Facility-Administered Medications  Medication Dose Route Frequency Provider Last Rate Last Admin   0.9 %  sodium chloride  infusion  500 mL Intravenous Once Alexis Cooper, Elspeth SQUIBB, MD        Allergies as of 11/15/2024   (No Known Allergies)    Family History  Problem Relation Age of Onset   Diabetes Mother    Breast cancer Mother 71   Glaucoma Mother    Heart disease Father    Congestive Heart Failure Father  Melanoma Sister 39   Colon cancer Neg Hx    Colon polyps Neg Hx    Esophageal cancer Neg Hx    Rectal cancer Neg Hx    Stomach cancer Neg Hx     Social History   Socioeconomic History   Marital status: Married    Spouse name: Not on file   Number of children: 2   Years of education: Not on file   Highest education level: Bachelor's degree (e.g., BA, AB, BS)  Occupational History   Occupation: Interior And Spatial Designer of operations  Tobacco Use   Smoking status: Former    Current packs/day: 0.00    Types: Cigarettes    Start date: 1982    Quit date: 1985    Years since quitting: 41.1   Smokeless tobacco: Never  Vaping Use   Vaping status: Never Used  Substance and Sexual Activity   Alcohol use: Yes    Comment: rarely, less than weekly   Drug use: No   Sexual activity: Not on file  Other Topics Concern    Not on file  Social History Narrative   Married   Social Drivers of Health   Tobacco Use: Medium Risk (11/15/2024)   Patient History    Smoking Tobacco Use: Former    Smokeless Tobacco Use: Never    Passive Exposure: Not on Actuary Strain: Low Risk (03/07/2024)   Overall Financial Resource Strain (CARDIA)    Difficulty of Paying Living Expenses: Not hard at all  Food Insecurity: No Food Insecurity (03/07/2024)   Hunger Vital Sign    Worried About Running Out of Food in the Last Year: Never true    Ran Out of Food in the Last Year: Never true  Transportation Needs: No Transportation Needs (03/07/2024)   PRAPARE - Administrator, Civil Service (Medical): No    Lack of Transportation (Non-Medical): No  Physical Activity: Sufficiently Active (03/07/2024)   Exercise Vital Sign    Days of Exercise per Week: 7 days    Minutes of Exercise per Session: 80 min  Stress: Stress Concern Present (03/07/2024)   [employer] of Occupational Health - Occupational Stress Questionnaire    Feeling of Stress : To some extent  Social Connections: Socially Integrated (03/07/2024)   Social Connection and Isolation Panel    Frequency of Communication with Friends and Family: More than three times a week    Frequency of Social Gatherings with Friends and Family: Twice a week    Attends Religious Services: More than 4 times per year    Active Member of [employer] or Organizations: Yes    Attends Banker Meetings: More than 4 times per year    Marital Status: Married  Catering Manager Violence: Not At Risk (03/07/2024)   Humiliation, Afraid, Rape, and Kick questionnaire    Fear of Current or Ex-Partner: No    Emotionally Abused: No    Physically Abused: No    Sexually Abused: No  Depression (PHQ2-9): Low Risk (03/07/2024)   Depression (PHQ2-9)    PHQ-2 Score: 3  Alcohol Screen: Low Risk (03/07/2024)   Alcohol Screen    Last Alcohol Screening Score (AUDIT): 1   Housing: Unknown (03/07/2024)   Housing Stability Vital Sign    Unable to Pay for Housing in the Last Year: No    Number of Times Moved in the Last Year: Not on file    Homeless in the Last Year: No  Utilities: Not At Risk (03/07/2024)  AHC Utilities    Threatened with loss of utilities: No  Health Literacy: Adequate Health Literacy (03/07/2024)   B1300 Health Literacy    Frequency of need for help with medical instructions: Never    Review of Systems: All other review of systems negative except as mentioned in the HPI.  Physical Exam: Vital signs BP (!) 146/89   Pulse 82   Temp 98 F (36.7 C) (Skin)   Resp 13   Ht 5' 4 (1.626 Cooper)   Wt 109 lb (49.4 kg)   SpO2 100%   BMI 18.71 kg/Cooper   General:   Alert,  Well-developed, pleasant and cooperative in NAD Lungs:  Clear throughout to auscultation.   Heart:  Regular rate and rhythm Abdomen:  Soft, nontender and nondistended.   Neuro/Psych:  Alert and cooperative. Normal mood and affect. A and O x 3  Alexis Naval, MD [facility]

## 2024-11-15 NOTE — Progress Notes (Signed)
 Report given to PACU, vss

## 2024-11-15 NOTE — Progress Notes (Signed)
 Called to room to assist during endoscopic procedure.  Patient ID and intended procedure confirmed with present staff. Received instructions for my participation in the procedure from the performing physician.

## 2024-11-15 NOTE — Patient Instructions (Signed)
 YOU HAD AN ENDOSCOPIC PROCEDURE TODAY AT THE [facility]:   Refer to the procedure report that was given to you for any specific questions about what was found during the examination.  If the procedure report does not answer your questions, please call your gastroenterologist to clarify.  If you requested that your care partner not be given the details of your procedure findings, then the procedure report has been included in a sealed envelope for you to review at your convenience later.  YOU SHOULD EXPECT: Some feelings of bloating in the abdomen. Passage of more gas than usual.  Walking can help get rid of the air that was put into your GI tract during the procedure and reduce the bloating. If you had a lower endoscopy (such as a colonoscopy or flexible sigmoidoscopy) you may notice spotting of blood in your stool or on the toilet paper. If you underwent a bowel prep for your procedure, you may not have a normal bowel movement for a few days.  Please Note:  You might notice some irritation and congestion in your nose or some drainage.  This is from the oxygen used during your procedure.  There is no need for concern and it should clear up in a day or so.  SYMPTOMS TO REPORT IMMEDIATELY:  Following upper endoscopy (EGD)  Vomiting of blood or coffee ground material  New chest pain or pain under the shoulder blades  Painful or persistently difficult swallowing  New shortness of breath  Fever of 100F or higher  Black, tarry-looking stools  Resume previous diet Continue present medications Await pathology results Return to normal activities tomorrow Handout on hiatal hernia given  For urgent or emergent issues, a gastroenterologist can be reached at any hour by calling [phone]. Do not use MyChart messaging for urgent concerns.    DIET:  We do recommend a small meal at first, but then you may proceed to your regular diet.  Drink plenty of fluids but you should avoid  alcoholic beverages for 24 hours.  ACTIVITY:  You should plan to take it easy for the rest of today and you should NOT DRIVE or use heavy machinery until tomorrow (because of the sedation medicines used during the test).    FOLLOW UP: Our staff will call the number listed on your records the next business day following your procedure.  We will call around 7:15- 8:00 am to check on you and address any questions or concerns that you may have regarding the information given to you following your procedure. If we do not reach you, we will leave a message.     If any biopsies were taken you will be contacted by phone or by letter within the next 1-3 weeks.  Please call us  at [phone] if you have not heard about the biopsies in 3 weeks.    SIGNATURES/CONFIDENTIALITY: You and/or your care partner have signed paperwork which will be entered into your electronic medical record.  These signatures attest to the fact that that the information above on your After Visit Summary has been reviewed and is understood.  Full responsibility of the confidentiality of this discharge information lies with you and/or your care-partner.

## 2024-11-15 NOTE — Op Note (Signed)
 [facility] Patient Name: Alexis Cooper Procedure Date: 11/15/2024 10:09 AM MRN: [id number] Endoscopist: Alexis P. Leigh , MD, [id number] Age: 69 Referring MD:  Date of Birth: Apr 29, 1956 Gender: Female Account #: [id number] Procedure:                Upper GI endoscopy Indications:              intermittent epigastric abdominal pain / pyrosis,                            symptoms suggestive of gastro-esophageal reflux                            disease - improved on omeprazole  40mg  / day                            significantly but not resolved. RUQ US  negative for                            gallstones Medicines:                Monitored Anesthesia Care Procedure:                Pre-Anesthesia Assessment:                           - Prior to the procedure, a History and Physical                            was performed, and patient medications and                            allergies were reviewed. The patient's tolerance of                            previous anesthesia was also reviewed. The risks                            and benefits of the procedure and the sedation                            options and risks were discussed with the patient.                            All questions were answered, and informed consent                            was obtained. Prior Anticoagulants: The patient has                            taken no anticoagulant or antiplatelet agents. ASA                            Grade Assessment: II - A patient with mild systemic  disease. After reviewing the risks and benefits,                            the patient was deemed in satisfactory condition to                            undergo the procedure.                           After obtaining informed consent, the endoscope was                            passed under direct vision. Throughout the                            procedure, the patient's blood pressure,  pulse, and                            oxygen saturations were monitored continuously. The                            Olympus Scope [id number] was introduced through the                            mouth, and advanced to the second part of duodenum.                            The upper GI endoscopy was accomplished without                            difficulty. The patient tolerated the procedure                            well. Scope In: Scope Out: Findings:                 Esophagogastric landmarks were identified: the                            Z-line was found at 36 cm, the gastroesophageal                            junction was found at 36 cm and the upper extent of                            the gastric folds was found at 38 cm from the                            incisors.                           A 2 cm hiatal hernia was present.                           The exam of the esophagus was  otherwise normal. No                            erosive changes or Barrett's.                           The entire examined stomach was normal. No ulcers.                            Biopsies were taken with a cold forceps for                            Helicobacter pylori testing.                           The examined duodenum was normal. Complications:            No immediate complications. Estimated blood loss:                            Minimal. Estimated Blood Loss:     Estimated blood loss was minimal. Impression:               - Esophagogastric landmarks identified.                           - 2 cm hiatal hernia.                           - Normal esophagus otherwise.                           - Normal stomach. Biopsied.                           - Normal examined duodenum.                           Overall, no high risk or concerning pathology.                            Symptoms suspect due to GERD or dyspepsia, has                            responded to omeprazole  40mg  / day, but  occasional                            breakthrough. Will discuss long term options with                            her - want to use lowest dose of omeprazole  needed                            to control symptoms. Can add Maalox / gaviscon /  TUMS PRN for breakthrough. Recommendation:           - Patient has a contact number available for                            emergencies. The signs and symptoms of potential                            delayed complications were discussed with the                            patient. Return to normal activities tomorrow.                            Written discharge instructions were provided to the                            patient.                           - Resume previous diet.                           - Continue present medications.                           - Await pathology results. Alexis P. Jerrilynn Mikowski, MD 11/15/2024 10:47:20 AM This report has been signed electronically.

## 2024-11-15 NOTE — Progress Notes (Signed)
1020 Robinul 0.1 mg IV given due large amount of secretions upon assessment.  MD made aware, vss 

## 2024-11-16 ENCOUNTER — Telehealth: Payer: Self-pay

## 2024-11-16 NOTE — Telephone Encounter (Signed)
" °  Follow up Call-     11/15/2024    9:30 AM 08/14/2022    7:16 AM  Call back number  Post procedure Call Back phone  # [phone]  Permission to leave phone message Yes Yes     Patient questions:  Do you have a fever, pain , or abdominal swelling? No. Pain Score  0 *  Have you tolerated food without any problems? Yes.    Have you been able to return to your normal activities? Yes.    Do you have any questions about your discharge instructions: Diet   No. Medications  No. Follow up visit  No.  Do you have questions or concerns about your Care? No.  Actions: * If pain score is 4 or above: No action needed, pain <4.   "

## 2024-11-17 LAB — SURGICAL PATHOLOGY

## 2024-11-20 ENCOUNTER — Ambulatory Visit: Payer: Self-pay | Admitting: Gastroenterology

## 2025-03-13 ENCOUNTER — Ambulatory Visit

## 2025-03-13 ENCOUNTER — Encounter: Admitting: Internal Medicine
